# Patient Record
Sex: Female | Born: 1990 | Race: White | Hispanic: Yes | Marital: Married | Smoking: Never smoker
Health system: Southern US, Community
[De-identification: ages and names within clinical notes are randomized; demographics above are authoritative.]

## PROBLEM LIST (undated history)

## (undated) DIAGNOSIS — G43109 Migraine with aura, not intractable, without status migrainosus: Secondary | ICD-10-CM

## (undated) HISTORY — PX: NO PAST SURGERIES: SHX2092

---
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
## 2010-05-16 ENCOUNTER — Ambulatory Visit: Payer: Self-pay | Admitting: Family Medicine

## 2010-05-16 ENCOUNTER — Inpatient Hospital Stay (HOSPITAL_COMMUNITY): Admission: AD | Admit: 2010-05-16 | Discharge: 2010-05-16 | Payer: Self-pay | Admitting: Family Medicine

## 2010-05-16 ENCOUNTER — Inpatient Hospital Stay (HOSPITAL_COMMUNITY): Admission: AD | Admit: 2010-05-16 | Discharge: 2010-05-19 | Payer: Self-pay | Admitting: Obstetrics and Gynecology

## 2010-12-05 LAB — CBC
HCT: 26.4 % — ABNORMAL LOW (ref 36.0–46.0)
HCT: 33.7 % — ABNORMAL LOW (ref 36.0–46.0)
Hemoglobin: 11.5 g/dL — ABNORMAL LOW (ref 12.0–15.0)
Hemoglobin: 9 g/dL — ABNORMAL LOW (ref 12.0–15.0)
MCV: 90.2 fL (ref 78.0–100.0)
Platelets: 203 10*3/uL (ref 150–400)
RBC: 2.92 MIL/uL — ABNORMAL LOW (ref 3.87–5.11)
RBC: 3.78 MIL/uL — ABNORMAL LOW (ref 3.87–5.11)
RDW: 14.6 % (ref 11.5–15.5)
WBC: 17 10*3/uL — ABNORMAL HIGH (ref 4.0–10.5)
WBC: 18.6 10*3/uL — ABNORMAL HIGH (ref 4.0–10.5)

## 2011-09-30 ENCOUNTER — Emergency Department (HOSPITAL_COMMUNITY)
Admission: EM | Admit: 2011-09-30 | Discharge: 2011-10-01 | Disposition: A | Discharge status: Home / Self Care | Payer: 59 | Attending: Emergency Medicine | Admitting: Emergency Medicine

## 2011-09-30 ENCOUNTER — Encounter (HOSPITAL_COMMUNITY): Payer: Self-pay | Admitting: *Deleted

## 2011-09-30 ENCOUNTER — Emergency Department (HOSPITAL_COMMUNITY): Payer: 59

## 2011-09-30 DIAGNOSIS — G43109 Migraine with aura, not intractable, without status migrainosus: Secondary | ICD-10-CM

## 2011-09-30 DIAGNOSIS — G43909 Migraine, unspecified, not intractable, without status migrainosus: Secondary | ICD-10-CM | POA: Insufficient documentation

## 2011-09-30 MED ORDER — KETOROLAC TROMETHAMINE 60 MG/2ML IM SOLN
60.0000 mg | Freq: Once | INTRAMUSCULAR | Status: AC
  Administered 2011-09-30: 60 mg via INTRAMUSCULAR
  Filled 2011-09-30: qty 2

## 2011-09-30 NOTE — ED Notes (Signed)
C/o tingling and numbness in right side

## 2011-09-30 NOTE — ED Notes (Signed)
Pt reports being at work & having right side numbness & headache. Numbness & head both have decreased at this time. NAD noted.

## 2011-10-01 MED ORDER — NAPROXEN 500 MG PO TABS: 500.0000 mg | ORAL_TABLET | Freq: Two times a day (BID) | ORAL | Status: AC

## 2011-10-01 MED ORDER — ASPIRIN 81 MG PO CHEW: 81.0000 mg | CHEWABLE_TABLET | Freq: Every day | ORAL | Status: AC

## 2011-10-01 NOTE — ED Provider Notes (Signed)
History     CSN: [id number]  Arrival date & time 09/30/11  1953   First MD Initiated Contact with Patient 09/30/11 2310      Chief Complaint  Patient presents with  . Numbness    (Consider location/radiation/quality/duration/timing/severity/associated sxs/prior treatment) The history is provided by the patient.   the patient reports developing a headache earlier today and then developing numbness and tingling of her right face right arm and right lower extremity.  She denies weakness of her bilateral upper lower extremities.  She denies neck pain.  She reports her headache has a waxing and waning component to it.  She's had no changes in her vision.  She denies nausea and vomiting.  She denies recent trauma to her neck or to her head.  She reports she's never really had headaches before.  She reports her numbness and tingling are improving and began approximately 5 and a half hours ago.  There is no family history of early stroke or heart disease.  She has no other significant medical problems.  She does report she has recently been treating herself with Mucinex and Alka-Seltzer for cough and nasal congestion.  History reviewed. No pertinent past medical history.  History reviewed. No pertinent past surgical history.  No family history on file.  History  Substance Use Topics  . Smoking status: Never Smoker   . Smokeless tobacco: Not on file  . Alcohol Use: No    OB History    Grav Para Term Preterm Abortions TAB SAB Ect Mult Living                  Review of Systems  All other systems reviewed and are negative.    Allergies  Review of patient's allergies indicates no known allergies.  Home Medications   Current Outpatient Rx  Name Route Sig Dispense Refill  . GUAIFENESIN ER 600 MG PO TB12 Oral Take 600 mg by mouth 2 (two) times daily.    Marland Kitchen PHENYLEPH-CPM-DM-APAP 01-20-09-325 MG PO CAPS Oral Take 2 capsules by mouth as needed. For cold symptoms. TAKEN ONCE    . ASPIRIN  81 MG PO CHEW Oral Chew 1 tablet (81 mg total) by mouth daily. 30 tablet 0  . NAPROXEN 500 MG PO TABS Oral Take 1 tablet (500 mg total) by mouth 2 (two) times daily. 30 tablet 0    BP 120/68  Pulse 72  Temp(Src) 98.4 F (36.9 C) (Oral)  Resp 16  Ht 5' (1.524 m)  Wt 120 lb (54.432 kg)  BMI 23.44 kg/m2  SpO2 100%  LMP 09/28/2011  Physical Exam  Nursing note and vitals reviewed. Constitutional: She is oriented to person, place, and time. She appears well-developed and well-nourished.  HENT:  Head: Normocephalic and atraumatic.  Eyes: Pupils are equal, round, and reactive to light.  Neck:       No carotid bruits noted bilaterally  Cardiovascular: Normal rate and regular rhythm.   Pulmonary/Chest: Effort normal and breath sounds normal. No respiratory distress.  Abdominal: Soft.  Neurological: She is alert and oriented to person, place, and time.       5/5 strength in major muscle groups of  bilateral upper and lower extremities. Speech normal. No facial asymetry.  The patient with decreased sensation to light touch of right face right arm right lower extremities.    ED Course  Procedures (including critical care time)  Labs Reviewed - No data to display Ct Head Wo Contrast  10/01/2011  *RADIOLOGY REPORT*  Clinical Data: Altered mental status, headache  CT HEAD WITHOUT CONTRAST  Technique:  Contiguous axial images were obtained from the base of the skull through the vertex without contrast.  Comparison: None  Findings: No acute intracranial hemorrhage.  No focal mass lesion. No CT evidence of acute infarction.   No midline shift or mass effect.  No hydrocephalus.  Basilar cisterns are patent.  There cerumen within the external auditory canals.  No fluid in the mastoid air cells.  Paranasal sinuses are clear.  Orbits appear normal.  IMPRESSION: No acute intracranial findings.  Original Report Authenticated By: Genevive Bi, M.D.     1. Complicated migraine       MDM  Her  headache is improved in the emergency department with portal.  With improvement in her headache came improvement in her numbness and tingling.  I suspect this is a complicated migraine.  My suspicion for stroke is very low.  Her head CT shows no intracranial mass lesions.  She will will refer to neurology.  Until she can receive followup with neurology appears reasonable to have her on 81 mg aspirin because I think the risk of aspirin is so low.  Her cardiac rhythm is normal and there is no suggestion of A. fib.  She said no palpitations.  She has no carotid bruits on exam.  My suspicion for carotid dissection is very low.  Besides the tingling and numbness her neurologic exam is otherwise normal.  The patient understands to return to the ER for any new or worsening symptoms        Lyanne Co, MD 10/01/11 0102

## 2011-10-26 ENCOUNTER — Other Ambulatory Visit: Payer: Self-pay | Admitting: Neurology

## 2011-10-26 DIAGNOSIS — R200 Anesthesia of skin: Secondary | ICD-10-CM

## 2011-10-28 ENCOUNTER — Ambulatory Visit (HOSPITAL_COMMUNITY)
Admission: RE | Admit: 2011-10-28 | Discharge: 2011-10-28 | Disposition: A | Discharge status: Home / Self Care | Payer: 59 | Source: Ambulatory Visit | Attending: Neurology | Admitting: Neurology

## 2011-10-28 DIAGNOSIS — R200 Anesthesia of skin: Secondary | ICD-10-CM

## 2011-10-28 DIAGNOSIS — R209 Unspecified disturbances of skin sensation: Secondary | ICD-10-CM | POA: Insufficient documentation

## 2011-10-28 DIAGNOSIS — R51 Headache: Secondary | ICD-10-CM | POA: Insufficient documentation

## 2012-09-22 IMAGING — CT CT HEAD W/O CM
1 series · 16 of 30 positions shown, 20 images · non-contrast
Comparison: None

CLINICAL DATA: Altered mental status, headache

CT HEAD WITHOUT CONTRAST
TECHNIQUE: Contiguous axial images were obtained from the base of
the skull through the vertex without contrast.

[Series 2: headseq 4.8 h37s · axial · 0.43mm/px · z∈[+1156,+1286]mm · 16 of 30 slices shown, 20 images]
[im 2/30  brain]
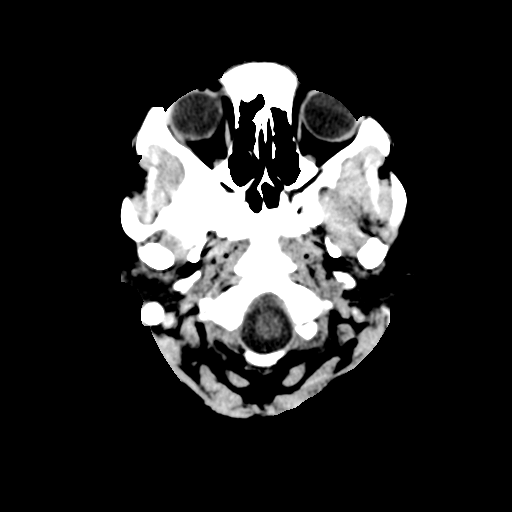
[im 2/30  bone]
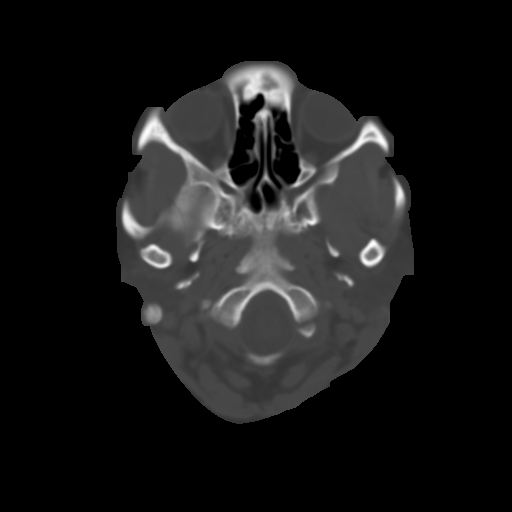
[im 4/30  brain]
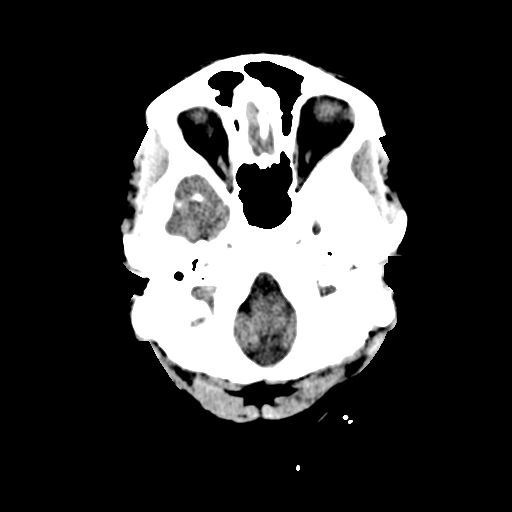
[im 6/30  brain]
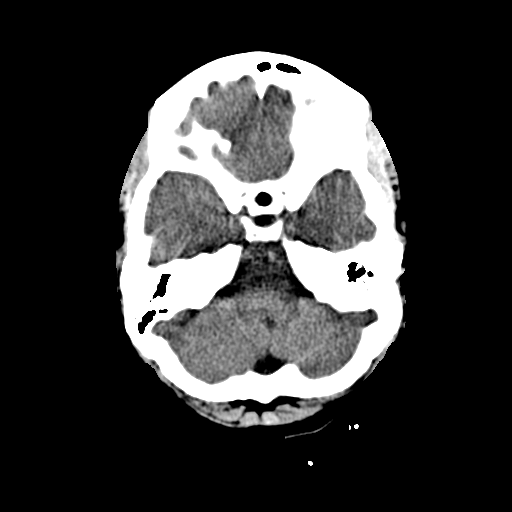
[im 8/30  brain]
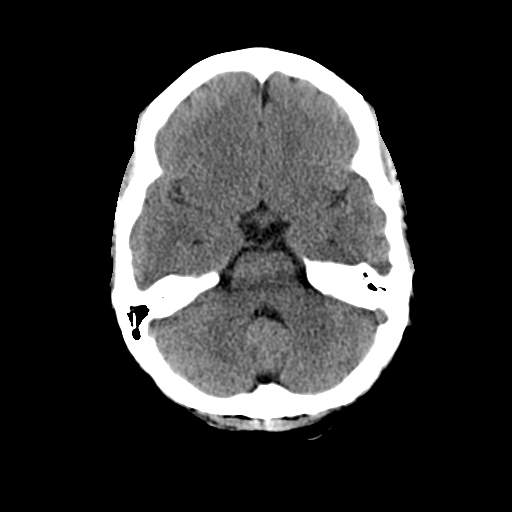
[im 9/30  brain]
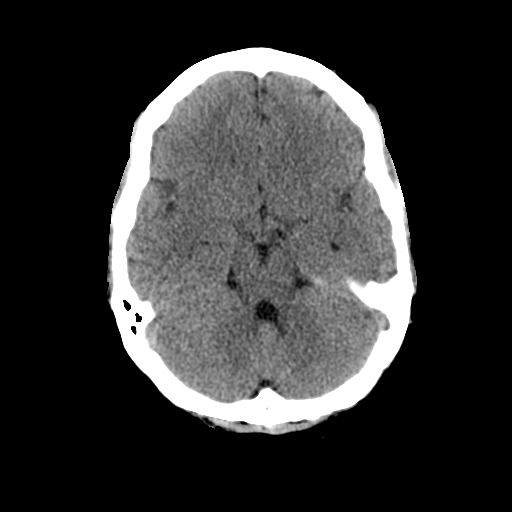
[im 9/30  bone]
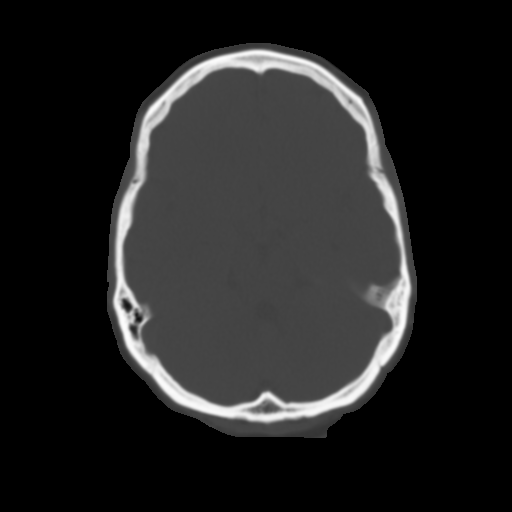
[im 11/30  brain]
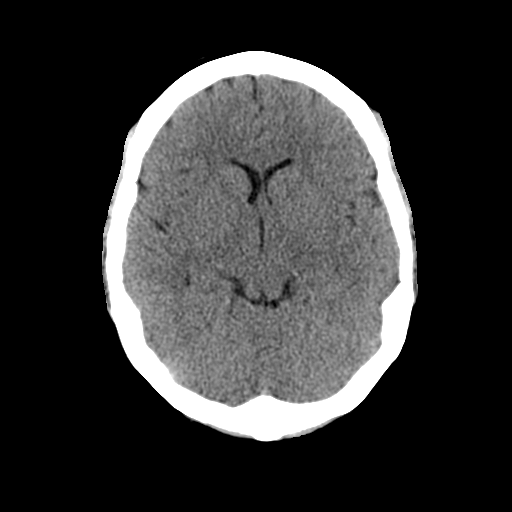
[im 13/30  brain]
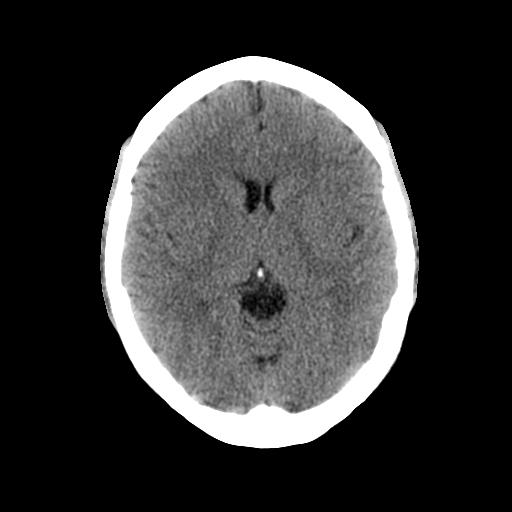
[im 15/30  brain]
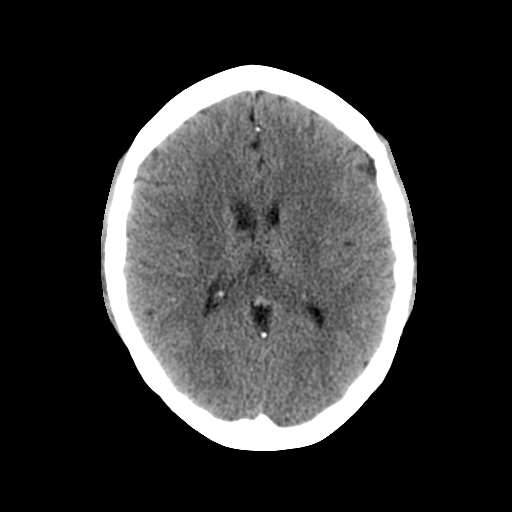
[im 16/30  brain]
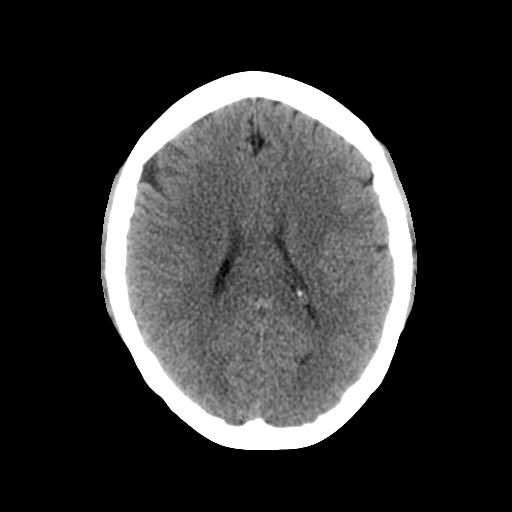
[im 16/30  bone]
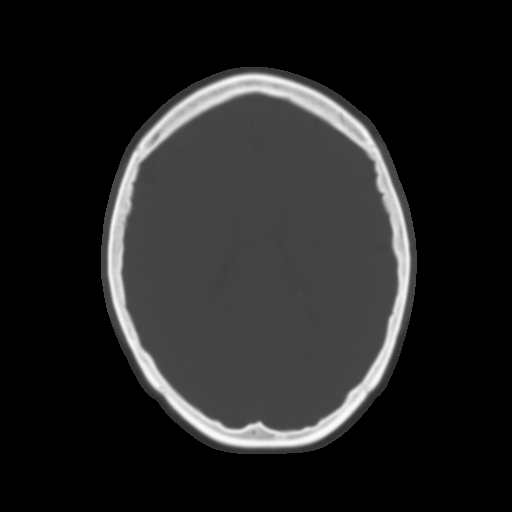
[im 18/30  brain]
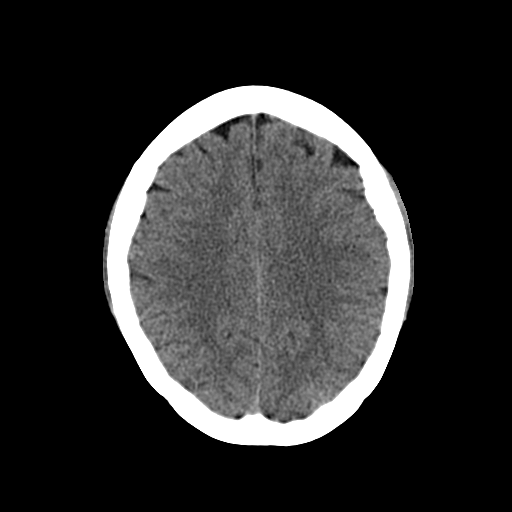
[im 20/30  brain]
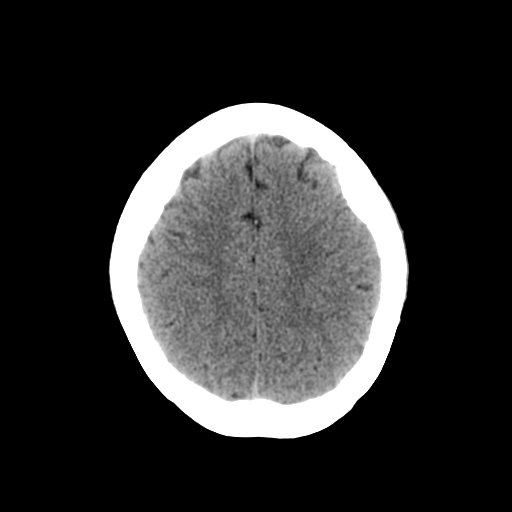
[im 22/30  brain]
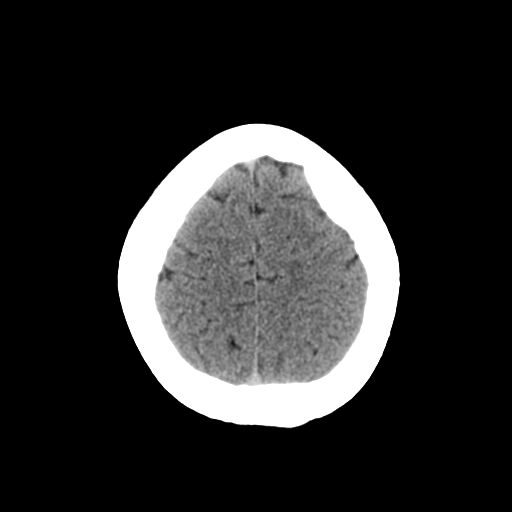
[im 23/30  brain]
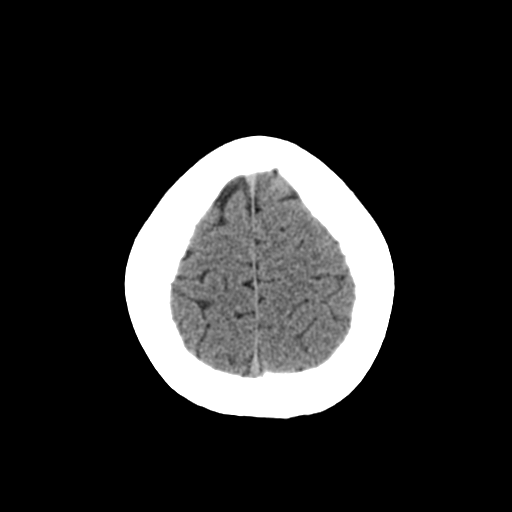
[im 23/30  bone]
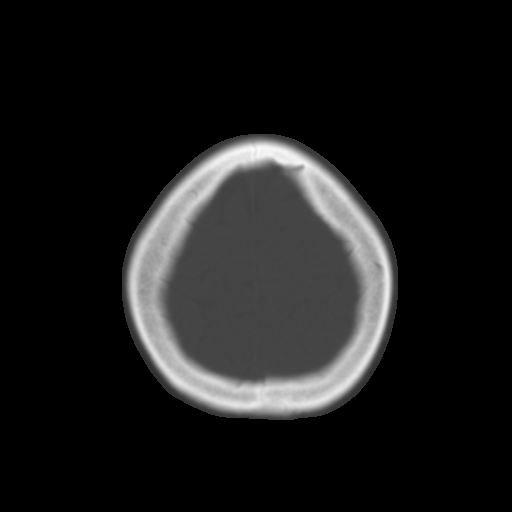
[im 25/30  brain]
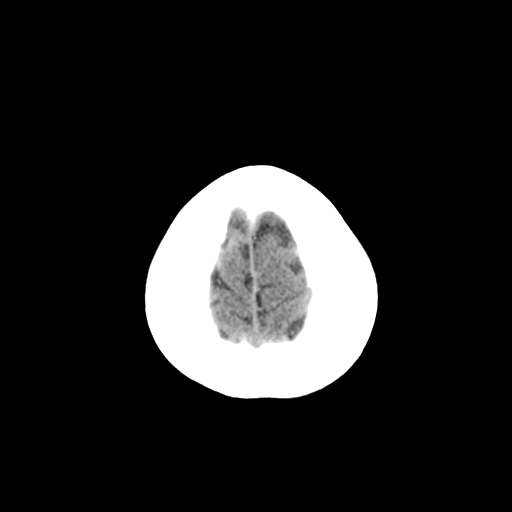
[im 27/30  brain]
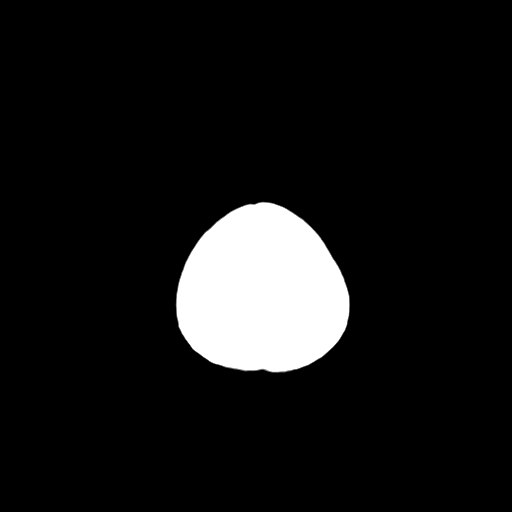
[im 29/30  brain]
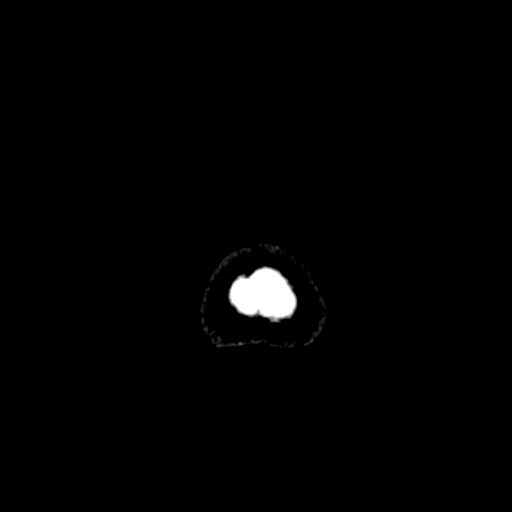

[16 of 30 positions shown; findings below may reference images not displayed]

FINDINGS: No acute intracranial hemorrhage.  No focal mass lesion.
No CT evidence of acute infarction.   No midline shift or mass
effect.  No hydrocephalus.  Basilar cisterns are patent.  There
cerumen within the external auditory canals.  No fluid in the
mastoid air cells.  Paranasal sinuses are clear.  Orbits appear
normal.
IMPRESSION: No acute intracranial findings.

## 2014-02-06 ENCOUNTER — Ambulatory Visit (INDEPENDENT_AMBULATORY_CARE_PROVIDER_SITE_OTHER): Payer: Managed Care, Other (non HMO) | Admitting: Nurse Practitioner

## 2014-02-06 ENCOUNTER — Encounter: Payer: Self-pay | Admitting: Nurse Practitioner

## 2014-02-06 ENCOUNTER — Encounter (INDEPENDENT_AMBULATORY_CARE_PROVIDER_SITE_OTHER): Payer: Self-pay

## 2014-02-06 VITALS — BP 100/66 | HR 76 | Temp 98.0°F | Ht 60.0 in | Wt 109.0 lb

## 2014-02-06 DIAGNOSIS — Z01419 Encounter for gynecological examination (general) (routine) without abnormal findings: Secondary | ICD-10-CM

## 2014-02-06 DIAGNOSIS — Z124 Encounter for screening for malignant neoplasm of cervix: Secondary | ICD-10-CM

## 2014-02-06 DIAGNOSIS — Z0000 Encounter for general adult medical examination without abnormal findings: Secondary | ICD-10-CM

## 2014-02-06 LAB — POCT URINALYSIS DIPSTICK
Bilirubin, UA: NEGATIVE
Glucose, UA: NEGATIVE
Ketones, UA: NEGATIVE
LEUKOCYTES UA: NEGATIVE
Nitrite, UA: NEGATIVE
PH UA: 5
PROTEIN UA: NEGATIVE
SPEC GRAV UA: 1.025
UROBILINOGEN UA: NEGATIVE

## 2014-02-06 LAB — POCT CBC
GRANULOCYTE PERCENT: 68.1 % (ref 37–80)
HEMATOCRIT: 40.6 % (ref 37.7–47.9)
HEMOGLOBIN: 12.8 g/dL (ref 12.2–16.2)
Lymph, poc: 2.3 (ref 0.6–3.4)
MCH, POC: 27.5 pg (ref 27–31.2)
MCHC: 31.6 g/dL — AB (ref 31.8–35.4)
MCV: 86.9 fL (ref 80–97)
MPV: 8.6 fL (ref 0–99.8)
POC GRANULOCYTE: 5.5 (ref 2–6.9)
POC LYMPH PERCENT: 28.7 %L (ref 10–50)
Platelet Count, POC: 257 10*3/uL (ref 142–424)
RBC: 4.7 M/uL (ref 4.04–5.48)
RDW, POC: 13.1 %
WBC: 8.1 10*3/uL (ref 4.6–10.2)

## 2014-02-06 NOTE — Progress Notes (Signed)
   Subjective:    Patient ID: Carly Harris, female    DOB: 03/18/1991, 23 y.o.   MRN: [id number]  HPI Patient in today for CPE and PAP- SHe currently has no medical problems and is on no meds. She has no complaints today.    Review of Systems  Constitutional: Negative.   HENT: Negative.   Respiratory: Negative.   Cardiovascular: Negative.   Gastrointestinal: Negative.   Genitourinary: Negative.   Neurological: Negative.   Hematological: Negative.   Psychiatric/Behavioral: Negative.   All other systems reviewed and are negative.      Objective:   Physical Exam  Constitutional: She is oriented to person, place, and time. She appears well-developed and well-nourished.  HENT:  Head: Normocephalic.  Right Ear: Hearing, tympanic membrane, external ear and ear canal normal.  Left Ear: Hearing, tympanic membrane, external ear and ear canal normal.  Nose: Nose normal.  Mouth/Throat: Uvula is midline and oropharynx is clear and moist.  Eyes: Conjunctivae and EOM are normal. Pupils are equal, round, and reactive to light.  Neck: Normal range of motion and full passive range of motion without pain. Neck supple. No JVD present. Carotid bruit is not present. No mass and no thyromegaly present.  Cardiovascular: Normal rate, normal heart sounds and intact distal pulses.   No murmur heard. Pulmonary/Chest: Effort normal and breath sounds normal. Right breast exhibits no inverted nipple, no mass, no nipple discharge, no skin change and no tenderness. Left breast exhibits no inverted nipple, no mass, no nipple discharge, no skin change and no tenderness.  Abdominal: Soft. Bowel sounds are normal. She exhibits no mass. There is no tenderness.  Genitourinary: Vagina normal and uterus normal. No breast swelling, tenderness, discharge or bleeding.  bimanual exam-No adnexal masses or tenderness. Cervix parous and pink - no discharge. Uterus anteverted  Musculoskeletal: Normal range of motion.    Lymphadenopathy:    She has no cervical adenopathy.  Neurological: She is alert and oriented to person, place, and time.  Skin: Skin is warm and dry.  Psychiatric: She has a normal mood and affect. Her behavior is normal. Judgment and thought content normal.   BP 100/66  Pulse 76  Temp(Src) 98 F (36.7 C) (Oral)  Ht 5' (1.524 m)  Wt 109 lb (49.442 kg)  BMI 21.29 kg/m2        Assessment & Plan:   1. Annual physical exam   2. Encounter for routine gynecological examination    Orders Placed This Encounter  Procedures  . CMP14+EGFR  . NMR, lipoprofile  . Thyroid Panel With TSH  . POCT urinalysis dipstick  . POCT CBC    Labs pending Health maintenance reviewed Diet and exercise encouraged Continue all meds Follow up  In 1 year   Mary-Margaret Martin, FNP    

## 2014-02-06 NOTE — Patient Instructions (Signed)

## 2014-02-07 LAB — NMR, LIPOPROFILE
CHOLESTEROL: 183 mg/dL (ref 100–199)
HDL Cholesterol by NMR: 52 mg/dL (ref >39)
HDL PARTICLE NUMBER: 35.6 umol/L (ref >=30.5)
LDL Particle Number: 1194 nmol/L — ABNORMAL HIGH (ref <1000)
LDL SIZE: 21.1 nm (ref >20.5)
LDLC SERPL CALC-MCNC: 111 mg/dL — ABNORMAL HIGH (ref 0–99)
LP-IR SCORE: 43 (ref <=45)
SMALL LDL PARTICLE NUMBER: 355 nmol/L (ref <=527)
TRIGLYCERIDES BY NMR: 99 mg/dL (ref 0–149)

## 2014-02-07 LAB — CMP14+EGFR
A/G RATIO: 1.9 (ref 1.1–2.5)
ALBUMIN: 4.5 g/dL (ref 3.5–5.5)
ALK PHOS: 72 IU/L (ref 39–117)
ALT: 195 IU/L — ABNORMAL HIGH (ref 0–32)
AST: 80 IU/L — ABNORMAL HIGH (ref 0–40)
BILIRUBIN TOTAL: 1 mg/dL (ref 0.0–1.2)
BUN / CREAT RATIO: 15 (ref 8–20)
BUN: 10 mg/dL (ref 6–20)
CHLORIDE: 105 mmol/L (ref 97–108)
CO2: 25 mmol/L (ref 18–29)
Calcium: 9.8 mg/dL (ref 8.7–10.2)
Creatinine, Ser: 0.66 mg/dL (ref 0.57–1.00)
GFR, EST AFRICAN AMERICAN: 142 mL/min/{1.73_m2} (ref >59)
GFR, EST NON AFRICAN AMERICAN: 123 mL/min/{1.73_m2} (ref >59)
GLOBULIN, TOTAL: 2.4 g/dL (ref 1.5–4.5)
GLUCOSE: 91 mg/dL (ref 65–99)
Potassium: 5.4 mmol/L — ABNORMAL HIGH (ref 3.5–5.2)
Sodium: 142 mmol/L (ref 134–144)
TOTAL PROTEIN: 6.9 g/dL (ref 6.0–8.5)

## 2014-02-07 LAB — THYROID PANEL WITH TSH
Free Thyroxine Index: 2 (ref 1.2–4.9)
T3 UPTAKE RATIO: 29 % (ref 24–39)
T4, Total: 6.8 ug/dL (ref 4.5–12.0)
TSH: 2.32 u[IU]/mL (ref 0.450–4.500)

## 2014-02-10 LAB — PAP IG, CT-NG, RFX HPV ASCU
Chlamydia, Nuc. Acid Amp: NEGATIVE
GONOCOCCUS BY NUCLEIC ACID AMP: NEGATIVE
PAP Smear Comment: 0

## 2014-02-13 ENCOUNTER — Telehealth: Payer: Self-pay | Admitting: Family Medicine

## 2014-02-13 NOTE — Telephone Encounter (Signed)
Message copied by Azalee Course on Tue Feb 13, 2014 12:17 PM ------      Message from: Bennie Pierini      Created: Tue Feb 13, 2014  8:02 AM       Pap negative repeat in 2 years ------

## 2014-03-29 ENCOUNTER — Encounter: Payer: Self-pay | Admitting: Adult Health

## 2014-03-29 ENCOUNTER — Ambulatory Visit (INDEPENDENT_AMBULATORY_CARE_PROVIDER_SITE_OTHER): Payer: BC Managed Care – PPO | Admitting: Adult Health

## 2014-03-29 DIAGNOSIS — Z3201 Encounter for pregnancy test, result positive: Secondary | ICD-10-CM

## 2014-03-29 LAB — POCT URINE PREGNANCY: Preg Test, Ur: POSITIVE

## 2014-04-04 ENCOUNTER — Other Ambulatory Visit: Payer: Self-pay | Admitting: Obstetrics and Gynecology

## 2014-04-04 DIAGNOSIS — O3680X0 Pregnancy with inconclusive fetal viability, not applicable or unspecified: Secondary | ICD-10-CM

## 2014-04-09 ENCOUNTER — Encounter: Payer: Self-pay | Admitting: Women's Health

## 2014-04-09 ENCOUNTER — Ambulatory Visit (INDEPENDENT_AMBULATORY_CARE_PROVIDER_SITE_OTHER): Payer: BC Managed Care – PPO | Admitting: Women's Health

## 2014-04-09 ENCOUNTER — Ambulatory Visit (INDEPENDENT_AMBULATORY_CARE_PROVIDER_SITE_OTHER): Payer: BC Managed Care – PPO

## 2014-04-09 ENCOUNTER — Other Ambulatory Visit: Payer: Self-pay | Admitting: Obstetrics and Gynecology

## 2014-04-09 VITALS — BP 88/58 | Wt 112.0 lb

## 2014-04-09 DIAGNOSIS — O26849 Uterine size-date discrepancy, unspecified trimester: Secondary | ICD-10-CM

## 2014-04-09 DIAGNOSIS — Z331 Pregnant state, incidental: Secondary | ICD-10-CM

## 2014-04-09 DIAGNOSIS — O09899 Supervision of other high risk pregnancies, unspecified trimester: Secondary | ICD-10-CM | POA: Insufficient documentation

## 2014-04-09 DIAGNOSIS — O3680X0 Pregnancy with inconclusive fetal viability, not applicable or unspecified: Secondary | ICD-10-CM

## 2014-04-09 DIAGNOSIS — Z3481 Encounter for supervision of other normal pregnancy, first trimester: Secondary | ICD-10-CM

## 2014-04-09 DIAGNOSIS — Z3480 Encounter for supervision of other normal pregnancy, unspecified trimester: Secondary | ICD-10-CM

## 2014-04-09 DIAGNOSIS — Z1389 Encounter for screening for other disorder: Secondary | ICD-10-CM

## 2014-04-09 LAB — POCT URINALYSIS DIPSTICK
Blood, UA: NEGATIVE
GLUCOSE UA: NEGATIVE
Ketones, UA: NEGATIVE
Leukocytes, UA: NEGATIVE
NITRITE UA: NEGATIVE
Protein, UA: NEGATIVE

## 2014-04-09 LAB — CBC
HEMATOCRIT: 35.3 % — AB (ref 36.0–46.0)
Hemoglobin: 11.9 g/dL — ABNORMAL LOW (ref 12.0–15.0)
MCH: 29 pg (ref 26.0–34.0)
MCHC: 33.7 g/dL (ref 30.0–36.0)
MCV: 86.1 fL (ref 78.0–100.0)
PLATELETS: 314 10*3/uL (ref 150–400)
RBC: 4.1 MIL/uL (ref 3.87–5.11)
RDW: 13.6 % (ref 11.5–15.5)
WBC: 11.1 10*3/uL — AB (ref 4.0–10.5)

## 2014-04-09 MED ORDER — DOXYLAMINE-PYRIDOXINE 10-10 MG PO TBEC: DELAYED_RELEASE_TABLET | ORAL | Status: DC

## 2014-04-09 NOTE — Patient Instructions (Signed)
Nausea & Vomiting  Have saltine crackers or pretzels by your bed and eat a few bites before you raise your head out of bed in the morning  Eat small frequent meals throughout the day instead of large meals  Drink plenty of fluids throughout the day to stay hydrated, just don't drink a lot of fluids with your meals.  This can make your stomach fill up faster making you feel sick  Do not brush your teeth right after you eat  Products with real ginger are good for nausea, like ginger ale and ginger hard candy Make sure it says made with real ginger!  Sucking on sour candy like lemon heads is also good for nausea  If your prenatal vitamins make you nauseated, take them at night so you will sleep through the nausea  If you feel like you need medicine for the nausea & vomiting please let us know  If you are unable to keep any fluids or food down please let us know    Pregnancy - First Trimester During sexual intercourse, millions of sperm go into the vagina. Only 1 sperm will penetrate and fertilize the female egg while it is in the Fallopian tube. One week later, the fertilized egg implants into the wall of the uterus. An embryo begins to develop into a baby. At 6 to 8 weeks, the eyes and face are formed and the heartbeat can be seen on ultrasound. At the end of 12 weeks (first trimester), all the baby's organs are formed. Now that you are pregnant, you will want to do everything you can to have a healthy baby. Two of the most important things are to get good prenatal care and follow your caregiver's instructions. Prenatal care is all the medical care you receive before the baby's birth. It is given to prevent, find, and treat problems during the pregnancy and childbirth. PRENATAL EXAMS  During prenatal visits, your weight, blood pressure, and urine are checked. This is done to make sure you are healthy and progressing normally during the pregnancy.  A pregnant woman should gain 25 to 35 pounds  during the pregnancy. However, if you are overweight or underweight, your caregiver will advise you regarding your weight.  Your caregiver will ask and answer questions for you.  Blood work, cervical cultures, other necessary tests, and a Pap test are done during your prenatal exams. These tests are done to check on your health and the probable health of your baby. Tests are strongly recommended and done for HIV with your permission. This is the virus that causes AIDS. These tests are done because medicines can be given to help prevent your baby from being born with this infection should you have been infected without knowing it. Blood work is also used to find out your blood type, previous infections, and follow your blood levels (hemoglobin).  Low hemoglobin (anemia) is common during pregnancy. Iron and vitamins are given to help prevent this. Later in the pregnancy, blood tests for diabetes will be done along with any other tests if any problems develop.  You may need other tests to make sure you and the baby are doing well. CHANGES DURING THE FIRST TRIMESTER  Your body goes through many changes during pregnancy. They vary from person to person. Talk to your caregiver about changes you notice and are concerned about. Changes can include:  Your menstrual period stops.  The egg and sperm carry the genes that determine what you look like. Genes from you   and your partner are forming a baby. The female genes determine whether the baby is a boy or a girl.  Your body increases in girth and you may feel bloated.  Feeling sick to your stomach (nauseous) and throwing up (vomiting). If the vomiting is uncontrollable, call your caregiver.  Your breasts will begin to enlarge and become tender.  Your nipples may stick out more and become darker.  The need to urinate more. Painful urination may mean you have a bladder infection.  Tiring easily.  Loss of appetite.  Cravings for certain kinds of  food.  At first, you may gain or lose a couple of pounds.  You may have changes in your emotions from day to day (excited to be pregnant or concerned something may go wrong with the pregnancy and baby).  You may have more vivid and strange dreams. HOME CARE INSTRUCTIONS   It is very important to avoid all smoking, alcohol and non-prescribed drugs during your pregnancy. These affect the formation and growth of the baby. Avoid chemicals while pregnant to ensure the delivery of a healthy infant.  Start your prenatal visits by the 12th week of pregnancy. They are usually scheduled monthly at first, then more often in the last 2 months before delivery. Keep your caregiver's appointments. Follow your caregiver's instructions regarding medicine use, blood and lab tests, exercise, and diet.  During pregnancy, you are providing food for you and your baby. Eat regular, well-balanced meals. Choose foods such as meat, fish, milk and other low fat dairy products, vegetables, fruits, and whole-grain breads and cereals. Your caregiver will tell you of the ideal weight gain.  You can help morning sickness by keeping soda crackers at the bedside. Eat a couple before arising in the morning. You may want to use the crackers without salt on them.  Eating 4 to 5 small meals rather than 3 large meals a day also may help the nausea and vomiting.  Drinking liquids between meals instead of during meals also seems to help nausea and vomiting.  A physical sexual relationship may be continued throughout pregnancy if there are no other problems. Problems may be early (premature) leaking of amniotic fluid from the membranes, vaginal bleeding, or belly (abdominal) pain.  Exercise regularly if there are no restrictions. Check with your caregiver or physical therapist if you are unsure of the safety of some of your exercises. Greater weight gain will occur in the last 2 trimesters of pregnancy. Exercising will  help:  Control your weight.  Keep you in shape.  Prepare you for labor and delivery.  Help you lose your pregnancy weight after you deliver your baby.  Wear a good support or jogging bra for breast tenderness during pregnancy. This may help if worn during sleep too.  Ask when prenatal classes are available. Begin classes when they are offered.  Do not use hot tubs, steam rooms, or saunas.  Wear your seat belt when driving. This protects you and your baby if you are in an accident.  Avoid raw meat, uncooked cheese, cat litter boxes, and soil used by cats throughout the pregnancy. These carry germs that can cause birth defects in the baby.  The first trimester is a good time to visit your dentist for your dental health. Getting your teeth cleaned is okay. Use a softer toothbrush and brush gently during pregnancy.  Ask for help if you have financial, counseling, or nutritional needs during pregnancy. Your caregiver will be able to offer counseling for   these needs as well as refer you for other special needs.  Do not take any medicines or herbs unless told by your caregiver.  Inform your caregiver if there is any mental or physical domestic violence.  Make a list of emergency phone numbers of family, friends, hospital, and police and fire departments.  Write down your questions. Take them to your prenatal visit.  Do not douche.  Do not cross your legs.  If you have to stand for long periods of time, rotate you feet or take small steps in a circle.  You may have more vaginal secretions that may require a sanitary pad. Do not use tampons or scented sanitary pads. MEDICINES AND DRUG USE IN PREGNANCY  Take prenatal vitamins as directed. The vitamin should contain 1 milligram of folic acid. Keep all vitamins out of reach of children. Only a couple vitamins or tablets containing iron may be fatal to a baby or young child when ingested.  Avoid use of all medicines, including herbs,  over-the-counter medicines, not prescribed or suggested by your caregiver. Only take over-the-counter or prescription medicines for pain, discomfort, or fever as directed by your caregiver. Do not use aspirin, ibuprofen, or naproxen unless directed by your caregiver.  Let your caregiver also know about herbs you may be using.  Alcohol is related to a number of birth defects. This includes fetal alcohol syndrome. All alcohol, in any form, should be avoided completely. Smoking will cause low birth rate and premature babies.  Street or illegal drugs are very harmful to the baby. They are absolutely forbidden. A baby born to an addicted mother will be addicted at birth. The baby will go through the same withdrawal an adult does.  Let your caregiver know about any medicines that you have to take and for what reason you take them. SEEK MEDICAL CARE IF:  You have any concerns or worries during your pregnancy. It is better to call with your questions if you feel they cannot wait, rather than worry about them. SEEK IMMEDIATE MEDICAL CARE IF:   An unexplained oral temperature above 102 F (38.9 C) develops, or as your caregiver suggests.  You have leaking of fluid from the vagina (birth canal). If leaking membranes are suspected, take your temperature and inform your caregiver of this when you call.  There is vaginal spotting or bleeding. Notify your caregiver of the amount and how many pads are used.  You develop a bad smelling vaginal discharge with a change in the color.  You continue to feel sick to your stomach (nauseated) and have no relief from remedies suggested. You vomit blood or coffee ground-like materials.  You lose more than 2 pounds of weight in 1 week.  You gain more than 2 pounds of weight in 1 week and you notice swelling of your face, hands, feet, or legs.  You gain 5 pounds or more in 1 week (even if you do not have swelling of your hands, face, legs, or feet).  You get  exposed to German measles and have never had them.  You are exposed to fifth disease or chickenpox.  You develop belly (abdominal) pain. Round ligament discomfort is a common non-cancerous (benign) cause of abdominal pain in pregnancy. Your caregiver still must evaluate this.  You develop headache, fever, diarrhea, pain with urination, or shortness of breath.  You fall or are in a car accident or have any kind of trauma.  There is mental or physical violence in your home. Document   Released: 09/01/2001 Document Revised: 06/01/2012 Document Reviewed: 07/18/2013 Exit[employer] Patient Information 2015 [employer]. This information is not intended to replace advice given to you by your health care provider. Make sure you discuss any questions you have with your health care provider.  

## 2014-04-09 NOTE — Progress Notes (Addendum)
  Subjective:  [name] is a 23 y.o. 772P1001 Hispanic female at 2033w6d by today's u/s, being seen today for her first obstetrical visit.  Her obstetrical history is significant for term uncomplicated SVB x 1.  Pregnancy history fully reviewed.  Patient reports n/v. Denies vb, cramping, uti s/s, abnormal/malodorous vag d/c, or vulvovaginal itching/irritation.  BP 88/58  Wt 112 lb (50.803 kg)  LMP 01/08/2014  HISTORY: OB History  Gravida Para Term Preterm AB SAB TAB Ectopic Multiple Living  2 1 1       1     # Outcome Date GA Lbr Len/2nd Weight Sex Delivery Anes PTL Lv  2 CUR           1 TRM 05/17/10 4682w0d  5 lb (2.268 kg) F SVD Other  Y     Past Medical History  Diagnosis Date  . Medical history non-contributory    Past Surgical History  Procedure Laterality Date  . No past surgeries     Family History  Problem Relation Age of Onset  . Diabetes Maternal Grandmother     Exam   System:     General: Well developed & nourished, no acute distress   Skin: Warm & dry, normal coloration and turgor, no rashes   Neurologic: Alert & oriented, normal mood   Cardiovascular: Regular rate & rhythm   Respiratory: Effort & rate normal, LCTAB, acyanotic   Abdomen: Soft, non tender   Extremities: normal strength, tone   Pelvic Exam:    Perineum: Normal perineum   Vulva: Normal, no lesions   Vagina:  Normal mucosa, normal discharge   Cervix: Normal, bulbous, appears closed   Uterus: Normal size/shape/contour for GA   Thin prep [facility] 2015[facility]  FHR: 135 via today's u/s   Assessment:   Pregnancy: G2P1001 Patient Active Problem List   Diagnosis Date Noted  . Supervision of other normal pregnancy 04/09/2014    Priority: High    4633w6d G2P1001 New OB visit N/V of pregnancy    Plan:  Initial labs drawn Continue prenatal vitamins Problem list reviewed and updated Reviewed n/v relief measures and warning s/s to report Rx diclegis, and  2 samples given.  Reviewed recommended weight gain based on pre-gravid BMI Encouraged well-balanced diet Genetic Screening discussed Integrated Screen: declined Cystic fibrosis screening discussed declined Ultrasound discussed; fetal survey: requested Follow up in 4 weeks for visit [facility] completed [facility]  Marge DuncansBooker, Frantz Quattrone Randall CNM, [facility]-BC 04/09/2014 2:54 PM

## 2014-04-09 NOTE — Addendum Note (Signed)
Addended by: Gaylyn RongEVANS, Aahana Elza A on: 04/09/2014 04:52 PM   Modules accepted: Orders

## 2014-04-09 NOTE — Progress Notes (Signed)
U/S-single IUP with +FCA noted, FHR-135 bpm, CRL c/w 6+6wks EDD 11/27/2014, cx appears closed, bilateral adnexa appears WNL

## 2014-04-10 LAB — URINE CULTURE
Colony Count: NO GROWTH
ORGANISM ID, BACTERIA: NO GROWTH

## 2014-04-10 LAB — HEPATITIS B SURFACE ANTIGEN: HEP B S AG: NEGATIVE

## 2014-04-10 LAB — URINALYSIS, ROUTINE W REFLEX MICROSCOPIC
BILIRUBIN URINE: NEGATIVE
GLUCOSE, UA: 250 mg/dL — AB
Hgb urine dipstick: NEGATIVE
KETONES UR: NEGATIVE mg/dL
Leukocytes, UA: NEGATIVE
Nitrite: NEGATIVE
PH: 8 (ref 5.0–8.0)
Protein, ur: NEGATIVE mg/dL
SPECIFIC GRAVITY, URINE: 1.02 (ref 1.005–1.030)
Urobilinogen, UA: 0.2 mg/dL (ref 0.0–1.0)

## 2014-04-10 LAB — ANTIBODY SCREEN: ANTIBODY SCREEN: NEGATIVE

## 2014-04-10 LAB — OXYCODONE SCREEN, UA, RFLX CONFIRM: Oxycodone Screen, Ur: NEGATIVE ng/mL

## 2014-04-10 LAB — DRUG SCREEN, URINE, NO CONFIRMATION
AMPHETAMINE SCRN UR: NEGATIVE
BARBITURATE QUANT UR: NEGATIVE
Benzodiazepines.: NEGATIVE
COCAINE METABOLITES: NEGATIVE
Creatinine,U: 157.1 mg/dL
Marijuana Metabolite: NEGATIVE
Methadone: NEGATIVE
OPIATE SCREEN, URINE: NEGATIVE
Phencyclidine (PCP): NEGATIVE
Propoxyphene: NEGATIVE

## 2014-04-10 LAB — RUBELLA SCREEN: RUBELLA: 6.16 {index} — AB (ref <0.90)

## 2014-04-10 LAB — HIV ANTIBODY (ROUTINE TESTING W REFLEX): HIV: NONREACTIVE

## 2014-04-10 LAB — ABO AND RH: Rh Type: POSITIVE

## 2014-04-10 LAB — RPR

## 2014-04-10 LAB — VARICELLA ZOSTER ANTIBODY, IGG: Varicella IgG: 305.8 Index — ABNORMAL HIGH (ref <135.00)

## 2014-04-17 ENCOUNTER — Telehealth: Payer: Self-pay | Admitting: Women's Health

## 2014-04-17 MED ORDER — PROMETHAZINE HCL 25 MG PO TABS: 25.0000 mg | ORAL_TABLET | Freq: Four times a day (QID) | ORAL | Status: DC | PRN

## 2014-04-23 ENCOUNTER — Telehealth: Payer: Self-pay | Admitting: *Deleted

## 2014-04-23 DIAGNOSIS — Z3481 Encounter for supervision of other normal pregnancy, first trimester: Secondary | ICD-10-CM

## 2014-04-23 DIAGNOSIS — Z029 Encounter for administrative examinations, unspecified: Secondary | ICD-10-CM

## 2014-04-23 NOTE — Telephone Encounter (Signed)
LMOM that new RX was sent to pharmacy.

## 2014-04-23 NOTE — Telephone Encounter (Signed)
Pt states she does not want to take a mediation that might make her sleepy, she states with her first pregnancy she used Zofran and it helped her, she wants to know if we could [facility] [place].

## 2014-04-24 NOTE — Telephone Encounter (Signed)
Pt states that she has been taking the Diclegis but they are not working for her. I advised the pt everything [facility] but the pt states that its just not working. Pt states that she needs something that works and doesn't make her sleepy. I advised the pt [facility]. Pt verbalized understanding.

## 2014-04-25 NOTE — Telephone Encounter (Signed)
Taking diclegis, not working well, doesn't want phenergan or anything that will make her sleepy. Took zofran last pregnancy- discussed potential risks for heart defects & cleft lip/palate w/ zofran. Pt wants to try other natural methods, 'sea bands', etc. If not helping, to let us know. If unable to keep any food/fluids down/feels dehydrated, to call us back/go to [facility] for fluids.  Cheral MarkerKimberly R. Keymani Harris, CNM, [facility]NP-BC 04/25/2014 1:57 PM

## 2014-05-07 ENCOUNTER — Ambulatory Visit (INDEPENDENT_AMBULATORY_CARE_PROVIDER_SITE_OTHER): Payer: Self-pay | Admitting: Women's Health

## 2014-05-07 VITALS — BP 84/58 | Wt 109.0 lb

## 2014-05-07 DIAGNOSIS — Z3480 Encounter for supervision of other normal pregnancy, unspecified trimester: Secondary | ICD-10-CM

## 2014-05-07 DIAGNOSIS — Z1389 Encounter for screening for other disorder: Secondary | ICD-10-CM

## 2014-05-07 DIAGNOSIS — Z331 Pregnant state, incidental: Secondary | ICD-10-CM

## 2014-05-07 DIAGNOSIS — Z3481 Encounter for supervision of other normal pregnancy, first trimester: Secondary | ICD-10-CM

## 2014-05-07 LAB — POCT URINALYSIS DIPSTICK
Blood, UA: NEGATIVE
GLUCOSE UA: NEGATIVE
Ketones, UA: NEGATIVE
LEUKOCYTES UA: NEGATIVE
NITRITE UA: NEGATIVE
Protein, UA: NEGATIVE

## 2014-05-07 NOTE — Progress Notes (Addendum)
Low-risk OB appointment G2P1001 6141w6d Estimated Date of Delivery: 11/27/14 BP 84/58  Wt 109 lb (49.442 kg)  LMP 01/08/2014  BP, weight, and urine reviewed.  Refer to obstetrical flow sheet for FH & FHR.  Denies cramping, lof, vb, or uti s/s. No complaints. Nausea much better! Reviewed warning s/s to report. Plan:  Continue routine obstetrical care  F/U in 4wks for OB appointment  Declines NT/IT

## 2014-05-07 NOTE — Patient Instructions (Signed)
First Trimester of Pregnancy The first trimester of pregnancy is from week 1 until the end of week 12 (months 1 through 3). A week after a sperm fertilizes an egg, the egg will implant on the wall of the uterus. This embryo will begin to develop into a baby. Genes from you and your partner are forming the baby. The female genes determine whether the baby is a boy or a girl. At 6-8 weeks, the eyes and face are formed, and the heartbeat can be seen on ultrasound. At the end of 12 weeks, all the baby's organs are formed.  Now that you are pregnant, you will want to do everything you can to have a healthy baby. Two of the most important things are to get good prenatal care and to follow your health care provider's instructions. Prenatal care is all the medical care you receive before the baby's birth. This care will help prevent, find, and treat any problems during the pregnancy and childbirth. BODY CHANGES Your body goes through many changes during pregnancy. The changes vary from woman to woman.   You may gain or lose a couple of pounds at first.  You may feel sick to your stomach (nauseous) and throw up (vomit). If the vomiting is uncontrollable, call your health care provider.  You may tire easily.  You may develop headaches that can be relieved by medicines approved by your health care provider.  You may urinate more often. Painful urination may mean you have a bladder infection.  You may develop heartburn as a result of your pregnancy.  You may develop constipation because certain hormones are causing the muscles that push waste through your intestines to slow down.  You may develop hemorrhoids or swollen, bulging veins (varicose veins).  Your breasts may begin to grow larger and become tender. Your nipples may stick out more, and the tissue that surrounds them (areola) may become darker.  Your gums may bleed and may be sensitive to brushing and flossing.  Dark spots or blotches (chloasma,  mask of pregnancy) may develop on your face. This will likely fade after the baby is born.  Your menstrual periods will stop.  You may have a loss of appetite.  You may develop cravings for certain kinds of food.  You may have changes in your emotions from day to day, such as being excited to be pregnant or being concerned that something may go wrong with the pregnancy and baby.  You may have more vivid and strange dreams.  You may have changes in your hair. These can include thickening of your hair, rapid growth, and changes in texture. Some women also have hair loss during or after pregnancy, or hair that feels dry or thin. Your hair will most likely return to normal after your baby is born. WHAT TO EXPECT AT YOUR PRENATAL VISITS During a routine prenatal visit:  You will be weighed to make sure you and the baby are growing normally.  Your blood pressure will be taken.  Your abdomen will be measured to track your baby's growth.  The fetal heartbeat will be listened to starting around week 10 or 12 of your pregnancy.  Test results from any previous visits will be discussed. Your health care provider may ask you:  How you are feeling.  If you are feeling the baby move.  If you have had any abnormal symptoms, such as leaking fluid, bleeding, severe headaches, or abdominal cramping.  If you have any questions. Other tests   that may be performed during your first trimester include:  Blood tests to find your blood type and to check for the presence of any previous infections. They will also be used to check for low iron levels (anemia) and Rh antibodies. Later in the pregnancy, blood tests for diabetes will be done along with other tests if problems develop.  Urine tests to check for infections, diabetes, or protein in the urine.  An ultrasound to confirm the proper growth and development of the baby.  An amniocentesis to check for possible genetic problems.  Fetal screens for  spina bifida and Down syndrome.  You may need other tests to make sure you and the baby are doing well. HOME CARE INSTRUCTIONS  Medicines  Follow your health care provider's instructions regarding medicine use. Specific medicines may be either safe or unsafe to take during pregnancy.  Take your prenatal vitamins as directed.  If you develop constipation, try taking a stool softener if your health care provider approves. Diet  Eat regular, well-balanced meals. Choose a variety of foods, such as meat or vegetable-based protein, fish, milk and low-fat dairy products, vegetables, fruits, and whole grain breads and cereals. Your health care provider will help you determine the amount of weight gain that is right for you.  Avoid raw meat and uncooked cheese. These carry germs that can cause birth defects in the baby.  Eating four or five small meals rather than three large meals a day may help relieve nausea and vomiting. If you start to feel nauseous, eating a few soda crackers can be helpful. Drinking liquids between meals instead of during meals also seems to help nausea and vomiting.  If you develop constipation, eat more high-fiber foods, such as fresh vegetables or fruit and whole grains. Drink enough fluids to keep your urine clear or pale yellow. Activity and Exercise  Exercise only as directed by your health care provider. Exercising will help you:  Control your weight.  Stay in shape.  Be prepared for labor and delivery.  Experiencing pain or cramping in the lower abdomen or low back is a good sign that you should stop exercising. Check with your health care provider before continuing normal exercises.  Try to avoid standing for long periods of time. Move your legs often if you must stand in one place for a long time.  Avoid heavy lifting.  Wear low-heeled shoes, and practice good posture.  You may continue to have sex unless your health care provider directs you  otherwise. Relief of Pain or Discomfort  Wear a good support bra for breast tenderness.   Take warm sitz baths to soothe any pain or discomfort caused by hemorrhoids. Use hemorrhoid cream if your health care provider approves.   Rest with your legs elevated if you have leg cramps or low back pain.  If you develop varicose veins in your legs, wear support hose. Elevate your feet for 15 minutes, 3-4 times a day. Limit salt in your diet. Prenatal Care  Schedule your prenatal visits by the twelfth week of pregnancy. They are usually scheduled monthly at first, then more often in the last 2 months before delivery.  Write down your questions. Take them to your prenatal visits.  Keep all your prenatal visits as directed by your health care provider. Safety  Wear your seat belt at all times when driving.  Make a list of emergency phone numbers, including numbers for family, friends, the hospital, and police and fire departments. General Tips    Ask your health care provider for a referral to a local prenatal education class. Begin classes no later than at the beginning of month 6 of your pregnancy.  Ask for help if you have counseling or nutritional needs during pregnancy. Your health care provider can offer advice or refer you to specialists for help with various needs.  Do not use hot tubs, steam rooms, or saunas.  Do not douche or use tampons or scented sanitary pads.  Do not cross your legs for long periods of time.  Avoid cat litter boxes and soil used by cats. These carry germs that can cause birth defects in the baby and possibly loss of the fetus by miscarriage or stillbirth.  Avoid all smoking, herbs, alcohol, and medicines not prescribed by your health care provider. Chemicals in these affect the formation and growth of the baby.  Schedule a dentist appointment. At home, brush your teeth with a soft toothbrush and be gentle when you floss. SEEK MEDICAL CARE IF:   You have  dizziness.  You have mild pelvic cramps, pelvic pressure, or nagging pain in the abdominal area.  You have persistent nausea, vomiting, or diarrhea.  You have a bad smelling vaginal discharge.  You have pain with urination.  You notice increased swelling in your face, hands, legs, or ankles. SEEK IMMEDIATE MEDICAL CARE IF:   You have a fever.  You are leaking fluid from your vagina.  You have spotting or bleeding from your vagina.  You have severe abdominal cramping or pain.  You have rapid weight gain or loss.  You vomit blood or material that looks like coffee grounds.  You are exposed to German measles and have never had them.  You are exposed to fifth disease or chickenpox.  You develop a severe headache.  You have shortness of breath.  You have any kind of trauma, such as from a fall or a car accident. Document Released: 09/01/2001 Document Revised: 01/22/2014 Document Reviewed: 07/18/2013 Exit[employer] Patient Information 2015 [employer]. This information is not intended to replace advice given to you by your health care provider. Make sure you discuss any questions you have with your health care provider.  

## 2014-05-08 ENCOUNTER — Encounter: Payer: Self-pay | Admitting: Women's Health

## 2014-05-08 LAB — GC/CHLAMYDIA PROBE AMP
CT Probe RNA: NEGATIVE
GC Probe RNA: NEGATIVE

## 2014-06-04 ENCOUNTER — Ambulatory Visit (INDEPENDENT_AMBULATORY_CARE_PROVIDER_SITE_OTHER): Payer: BC Managed Care – PPO | Admitting: Women's Health

## 2014-06-04 ENCOUNTER — Encounter: Payer: Self-pay | Admitting: Women's Health

## 2014-06-04 VITALS — BP 90/48 | Wt 108.0 lb

## 2014-06-04 DIAGNOSIS — Z1389 Encounter for screening for other disorder: Secondary | ICD-10-CM

## 2014-06-04 DIAGNOSIS — Z331 Pregnant state, incidental: Secondary | ICD-10-CM

## 2014-06-04 DIAGNOSIS — Z3482 Encounter for supervision of other normal pregnancy, second trimester: Secondary | ICD-10-CM

## 2014-06-04 DIAGNOSIS — Z3480 Encounter for supervision of other normal pregnancy, unspecified trimester: Secondary | ICD-10-CM

## 2014-06-04 LAB — POCT URINALYSIS DIPSTICK
Blood, UA: NEGATIVE
Glucose, UA: NEGATIVE
KETONES UA: NEGATIVE
LEUKOCYTES UA: NEGATIVE
Nitrite, UA: NEGATIVE
PROTEIN UA: NEGATIVE

## 2014-06-04 NOTE — Patient Instructions (Signed)
Second Trimester of Pregnancy The second trimester is from week 13 through week 28, months 4 through 6. The second trimester is often a time when you feel your best. Your body has also adjusted to being pregnant, and you begin to feel better physically. Usually, morning sickness has lessened or quit completely, you may have more energy, and you may have an increase in appetite. The second trimester is also a time when the fetus is growing rapidly. At the end of the sixth month, the fetus is about 9 inches long and weighs about 1 pounds. You will likely begin to feel the baby move (quickening) between 18 and 20 weeks of the pregnancy. BODY CHANGES Your body goes through many changes during pregnancy. The changes vary from woman to woman.   Your weight will continue to increase. You will notice your lower abdomen bulging out.  You may begin to get stretch marks on your hips, abdomen, and breasts.  You may develop headaches that can be relieved by medicines approved by your health care provider.  You may urinate more often because the fetus is pressing on your bladder.  You may develop or continue to have heartburn as a result of your pregnancy.  You may develop constipation because certain hormones are causing the muscles that push waste through your intestines to slow down.  You may develop hemorrhoids or swollen, bulging veins (varicose veins).  You may have back pain because of the weight gain and pregnancy hormones relaxing your joints between the bones in your pelvis and as a result of a shift in weight and the muscles that support your balance.  Your breasts will continue to grow and be tender.  Your gums may bleed and may be sensitive to brushing and flossing.  Dark spots or blotches (chloasma, mask of pregnancy) may develop on your face. This will likely fade after the baby is born.  A dark line from your belly button to the pubic area (linea nigra) may appear. This will likely fade  after the baby is born.  You may have changes in your hair. These can include thickening of your hair, rapid growth, and changes in texture. Some women also have hair loss during or after pregnancy, or hair that feels dry or thin. Your hair will most likely return to normal after your baby is born. WHAT TO EXPECT AT YOUR PRENATAL VISITS During a routine prenatal visit:  You will be weighed to make sure you and the fetus are growing normally.  Your blood pressure will be taken.  Your abdomen will be measured to track your baby's growth.  The fetal heartbeat will be listened to.  Any test results from the previous visit will be discussed. Your health care provider may ask you:  How you are feeling.  If you are feeling the baby move.  If you have had any abnormal symptoms, such as leaking fluid, bleeding, severe headaches, or abdominal cramping.  If you have any questions. Other tests that may be performed during your second trimester include:  Blood tests that check for:  Low iron levels (anemia).  Gestational diabetes (between 24 and 28 weeks).  Rh antibodies.  Urine tests to check for infections, diabetes, or protein in the urine.  An ultrasound to confirm the proper growth and development of the baby.  An amniocentesis to check for possible genetic problems.  Fetal screens for spina bifida and Down syndrome. HOME CARE INSTRUCTIONS   Avoid all smoking, herbs, alcohol, and unprescribed   drugs. These chemicals affect the formation and growth of the baby.  Follow your health care provider's instructions regarding medicine use. There are medicines that are either safe or unsafe to take during pregnancy.  Exercise only as directed by your health care provider. Experiencing uterine cramps is a good sign to stop exercising.  Continue to eat regular, healthy meals.  Wear a good support bra for breast tenderness.  Do not use hot tubs, steam rooms, or saunas.  Wear your  seat belt at all times when driving.  Avoid raw meat, uncooked cheese, cat litter boxes, and soil used by cats. These carry germs that can cause birth defects in the baby.  Take your prenatal vitamins.  Try taking a stool softener (if your health care provider approves) if you develop constipation. Eat more high-fiber foods, such as fresh vegetables or fruit and whole grains. Drink plenty of fluids to keep your urine clear or pale yellow.  Take warm sitz baths to soothe any pain or discomfort caused by hemorrhoids. Use hemorrhoid cream if your health care provider approves.  If you develop varicose veins, wear support hose. Elevate your feet for 15 minutes, 3-4 times a day. Limit salt in your diet.  Avoid heavy lifting, wear low heel shoes, and practice good posture.  Rest with your legs elevated if you have leg cramps or low back pain.  Visit your dentist if you have not gone yet during your pregnancy. Use a soft toothbrush to brush your teeth and be gentle when you floss.  A sexual relationship may be continued unless your health care provider directs you otherwise.  Continue to go to all your prenatal visits as directed by your health care provider. SEEK MEDICAL CARE IF:   You have dizziness.  You have mild pelvic cramps, pelvic pressure, or nagging pain in the abdominal area.  You have persistent nausea, vomiting, or diarrhea.  You have a bad smelling vaginal discharge.  You have pain with urination. SEEK IMMEDIATE MEDICAL CARE IF:   You have a fever.  You are leaking fluid from your vagina.  You have spotting or bleeding from your vagina.  You have severe abdominal cramping or pain.  You have rapid weight gain or loss.  You have shortness of breath with chest pain.  You notice sudden or extreme swelling of your face, hands, ankles, feet, or legs.  You have not felt your baby move in over an hour.  You have severe headaches that do not go away with  medicine.  You have vision changes. Document Released: 09/01/2001 Document Revised: 09/12/2013 Document Reviewed: 11/08/2012 Exit[employer] Patient Information 2015 [employer]. This information is not intended to replace advice given to you by your health care provider. Make sure you discuss any questions you have with your health care provider.  

## 2014-06-04 NOTE — Progress Notes (Signed)
Low-risk OB appointment G2P1001 [redacted]w[redacted]d Estimated Date of Delivery: 11/27/14 BP 90/48  Wt 108 lb (48.988 kg)  LMP 01/08/2014  BP, weight, and urine reviewed.  Refer to obstetrical flow sheet for FH & FHR.  No fm yet. Denies cramping, lof, vb, or uti s/s. Some ha's, to increase fluids, apap prn Has lost 4lbs since 6wks, states she's eating well, no n/v/d. 24hr diet recall sounds adequate and nutritious. Will monitor weight.  Reviewed warning s/s to report. Plan:  Continue routine obstetrical care  F/U in 3wks for OB appointment Declines NT/IT

## 2014-06-25 ENCOUNTER — Encounter: Payer: BC Managed Care – PPO | Admitting: Women's Health

## 2014-06-27 ENCOUNTER — Ambulatory Visit (INDEPENDENT_AMBULATORY_CARE_PROVIDER_SITE_OTHER): Payer: BC Managed Care – PPO | Admitting: Women's Health

## 2014-06-27 ENCOUNTER — Encounter: Payer: Self-pay | Admitting: Women's Health

## 2014-06-27 VITALS — BP 94/50 | Wt 110.0 lb

## 2014-06-27 DIAGNOSIS — Z1389 Encounter for screening for other disorder: Secondary | ICD-10-CM

## 2014-06-27 DIAGNOSIS — Z3482 Encounter for supervision of other normal pregnancy, second trimester: Secondary | ICD-10-CM

## 2014-06-27 DIAGNOSIS — Z331 Pregnant state, incidental: Secondary | ICD-10-CM

## 2014-06-27 DIAGNOSIS — Z363 Encounter for antenatal screening for malformations: Secondary | ICD-10-CM

## 2014-06-27 DIAGNOSIS — Z3492 Encounter for supervision of normal pregnancy, unspecified, second trimester: Secondary | ICD-10-CM

## 2014-06-27 LAB — POCT URINALYSIS DIPSTICK
KETONES UA: NEGATIVE
Leukocytes, UA: NEGATIVE
NITRITE UA: NEGATIVE
Protein, UA: NEGATIVE
RBC UA: NEGATIVE

## 2014-06-27 NOTE — Progress Notes (Signed)
Low-risk OB appointment G2P1001 6159w1d Estimated Date of Delivery: 11/27/14 BP 94/50  Wt 110 lb (49.896 kg)  LMP 01/08/2014  BP, weight, and urine reviewed.  Refer to obstetrical flow sheet for FH & FHR.  Reports good fm.  Denies regular uc's, lof, vb, or uti s/s. No complaints. Reviewed warning s/s to report, fm. Plan:  Continue routine obstetrical care  F/U in 2wks for OB appointment and anatomy u/s Declined flu shot, wants to think about it some more

## 2014-06-27 NOTE — Patient Instructions (Signed)
Second Trimester of Pregnancy The second trimester is from week 13 through week 28, months 4 through 6. The second trimester is often a time when you feel your best. Your body has also adjusted to being pregnant, and you begin to feel better physically. Usually, morning sickness has lessened or quit completely, you may have more energy, and you may have an increase in appetite. The second trimester is also a time when the fetus is growing rapidly. At the end of the sixth month, the fetus is about 9 inches long and weighs about 1 pounds. You will likely begin to feel the baby move (quickening) between 18 and 20 weeks of the pregnancy. BODY CHANGES Your body goes through many changes during pregnancy. The changes vary from woman to woman.   Your weight will continue to increase. You will notice your lower abdomen bulging out.  You may begin to get stretch marks on your hips, abdomen, and breasts.  You may develop headaches that can be relieved by medicines approved by your health care provider.  You may urinate more often because the fetus is pressing on your bladder.  You may develop or continue to have heartburn as a result of your pregnancy.  You may develop constipation because certain hormones are causing the muscles that push waste through your intestines to slow down.  You may develop hemorrhoids or swollen, bulging veins (varicose veins).  You may have back pain because of the weight gain and pregnancy hormones relaxing your joints between the bones in your pelvis and as a result of a shift in weight and the muscles that support your balance.  Your breasts will continue to grow and be tender.  Your gums may bleed and may be sensitive to brushing and flossing.  Dark spots or blotches (chloasma, mask of pregnancy) may develop on your face. This will likely fade after the baby is born.  A dark line from your belly button to the pubic area (linea nigra) may appear. This will likely fade  after the baby is born.  You may have changes in your hair. These can include thickening of your hair, rapid growth, and changes in texture. Some women also have hair loss during or after pregnancy, or hair that feels dry or thin. Your hair will most likely return to normal after your baby is born. WHAT TO EXPECT AT YOUR PRENATAL VISITS During a routine prenatal visit:  You will be weighed to make sure you and the fetus are growing normally.  Your blood pressure will be taken.  Your abdomen will be measured to track your baby's growth.  The fetal heartbeat will be listened to.  Any test results from the previous visit will be discussed. Your health care provider may ask you:  How you are feeling.  If you are feeling the baby move.  If you have had any abnormal symptoms, such as leaking fluid, bleeding, severe headaches, or abdominal cramping.  If you have any questions. Other tests that may be performed during your second trimester include:  Blood tests that check for:  Low iron levels (anemia).  Gestational diabetes (between 24 and 28 weeks).  Rh antibodies.  Urine tests to check for infections, diabetes, or protein in the urine.  An ultrasound to confirm the proper growth and development of the baby.  An amniocentesis to check for possible genetic problems.  Fetal screens for spina bifida and Down syndrome. HOME CARE INSTRUCTIONS   Avoid all smoking, herbs, alcohol, and unprescribed   drugs. These chemicals affect the formation and growth of the baby.  Follow your health care provider's instructions regarding medicine use. There are medicines that are either safe or unsafe to take during pregnancy.  Exercise only as directed by your health care provider. Experiencing uterine cramps is a good sign to stop exercising.  Continue to eat regular, healthy meals.  Wear a good support bra for breast tenderness.  Do not use hot tubs, steam rooms, or saunas.  Wear your  seat belt at all times when driving.  Avoid raw meat, uncooked cheese, cat litter boxes, and soil used by cats. These carry germs that can cause birth defects in the baby.  Take your prenatal vitamins.  Try taking a stool softener (if your health care provider approves) if you develop constipation. Eat more high-fiber foods, such as fresh vegetables or fruit and whole grains. Drink plenty of fluids to keep your urine clear or pale yellow.  Take warm sitz baths to soothe any pain or discomfort caused by hemorrhoids. Use hemorrhoid cream if your health care provider approves.  If you develop varicose veins, wear support hose. Elevate your feet for 15 minutes, 3-4 times a day. Limit salt in your diet.  Avoid heavy lifting, wear low heel shoes, and practice good posture.  Rest with your legs elevated if you have leg cramps or low back pain.  Visit your dentist if you have not gone yet during your pregnancy. Use a soft toothbrush to brush your teeth and be gentle when you floss.  A sexual relationship may be continued unless your health care provider directs you otherwise.  Continue to go to all your prenatal visits as directed by your health care provider. SEEK MEDICAL CARE IF:   You have dizziness.  You have mild pelvic cramps, pelvic pressure, or nagging pain in the abdominal area.  You have persistent nausea, vomiting, or diarrhea.  You have a bad smelling vaginal discharge.  You have pain with urination. SEEK IMMEDIATE MEDICAL CARE IF:   You have a fever.  You are leaking fluid from your vagina.  You have spotting or bleeding from your vagina.  You have severe abdominal cramping or pain.  You have rapid weight gain or loss.  You have shortness of breath with chest pain.  You notice sudden or extreme swelling of your face, hands, ankles, feet, or legs.  You have not felt your baby move in over an hour.  You have severe headaches that do not go away with  medicine.  You have vision changes. Document Released: 09/01/2001 Document Revised: 09/12/2013 Document Reviewed: 11/08/2012 Exit[employer] Patient Information 2015 [employer]. This information is not intended to replace advice given to you by your health care provider. Make sure you discuss any questions you have with your health care provider.  

## 2014-07-10 ENCOUNTER — Telehealth: Payer: Self-pay | Admitting: Nurse Practitioner

## 2014-07-11 ENCOUNTER — Other Ambulatory Visit: Payer: BC Managed Care – PPO

## 2014-07-11 ENCOUNTER — Encounter: Payer: BC Managed Care – PPO | Admitting: Advanced Practice Midwife

## 2014-07-12 ENCOUNTER — Ambulatory Visit (INDEPENDENT_AMBULATORY_CARE_PROVIDER_SITE_OTHER): Payer: BC Managed Care – PPO

## 2014-07-12 ENCOUNTER — Ambulatory Visit (INDEPENDENT_AMBULATORY_CARE_PROVIDER_SITE_OTHER): Payer: BC Managed Care – PPO | Admitting: Advanced Practice Midwife

## 2014-07-12 ENCOUNTER — Encounter: Payer: Self-pay | Admitting: Advanced Practice Midwife

## 2014-07-12 VITALS — BP 110/70 | Wt 110.0 lb

## 2014-07-12 DIAGNOSIS — Z3482 Encounter for supervision of other normal pregnancy, second trimester: Secondary | ICD-10-CM

## 2014-07-12 DIAGNOSIS — Z1389 Encounter for screening for other disorder: Secondary | ICD-10-CM

## 2014-07-12 DIAGNOSIS — Z363 Encounter for antenatal screening for malformations: Secondary | ICD-10-CM

## 2014-07-12 DIAGNOSIS — Z36 Encounter for antenatal screening of mother: Secondary | ICD-10-CM

## 2014-07-12 DIAGNOSIS — Z331 Pregnant state, incidental: Secondary | ICD-10-CM

## 2014-07-12 LAB — POCT URINALYSIS DIPSTICK
Glucose, UA: NEGATIVE
KETONES UA: NEGATIVE
LEUKOCYTES UA: NEGATIVE
NITRITE UA: NEGATIVE
Protein, UA: NEGATIVE
RBC UA: NEGATIVE

## 2014-07-12 NOTE — Progress Notes (Signed)
U/S(20+2wks) active fetus, meas c/w dates, fluid wnl, posterior Gr 0 placenta, cx appears closed, bilateral adnexa appears WNL, female fetus, no major abnl noted , FHR-147 bpm

## 2014-07-12 NOTE — Progress Notes (Signed)
G2P1001 6057w2d Estimated Date of Delivery: 11/27/14  Blood pressure 110/70, weight 110 lb (49.896 kg), last menstrual period 01/08/2014.   BP weight and urine results all reviewed and noted.  Please refer to the obstetrical flow sheet for the fundal height and fetal heart rate documentation: Had anatomy scan:  Normal female  Patient reports good fetal movement, denies any bleeding and no rupture of membranes symptoms or regular contractions. Patient is without complaints. All questions were answered.  Plan:  Continued routine obstetrical care,   Follow up in 4 weeks for OB appointment,

## 2014-07-12 NOTE — Progress Notes (Signed)
Pt denies any problems or concerns at this time.  

## 2014-07-17 ENCOUNTER — Ambulatory Visit (INDEPENDENT_AMBULATORY_CARE_PROVIDER_SITE_OTHER): Payer: BC Managed Care – PPO | Admitting: Family Medicine

## 2014-07-17 VITALS — BP 91/56 | HR 83 | Temp 97.5°F | Ht 60.0 in | Wt 110.8 lb

## 2014-07-17 DIAGNOSIS — J069 Acute upper respiratory infection, unspecified: Secondary | ICD-10-CM

## 2014-07-17 MED ORDER — AZITHROMYCIN 250 MG PO TABS: ORAL_TABLET | ORAL | Status: DC

## 2014-07-18 NOTE — Progress Notes (Signed)
   Subjective:    Patient ID: Carly Harris, female    DOB: Feb 22, 1991, 23 y.o.   MRN: [id number]  HPI This 23 y.o. female presents for evaluation of URI sx's.   Review of Systems No chest pain, SOB, HA, dizziness, vision change, N/V, diarrhea, constipation, dysuria, urinary urgency or frequency, myalgias, arthralgias or rash.     Objective:   Physical Exam  Vital signs noted  Well developed well nourished female.  HEENT - Head atraumatic Normocephalic                Eyes - PERRLA, Conjuctiva - clear Sclera- Clear EOMI                Ears - EAC's Wnl TM's Wnl Gross Hearing WNL                Nose - Nares patent                 Throat - oropharanx wnl Respiratory - Lungs CTA bilateral Cardiac - RRR S1 and S2 without murmur GI - Abdomen soft Nontender and bowel sounds active x 4 Extremities - No edema. Neuro - Grossly intact.      Assessment & Plan:  URI (upper respiratory infection) - Plan: azithromycin (ZITHROMAX) 250 MG tablet Push po fluids, rest, tylenol and motrin otc prn as directed for fever, arthralgias, and myalgias.  Follow up prn if sx's continue or persist.  Carly CanterWilliam J Abigail Marsiglia FNP

## 2014-07-19 NOTE — Telephone Encounter (Signed)
Pt was seen on Wednesday so will close this encounter.

## 2014-07-23 ENCOUNTER — Encounter: Payer: Self-pay | Admitting: Advanced Practice Midwife

## 2014-08-09 ENCOUNTER — Encounter: Payer: Self-pay | Admitting: Advanced Practice Midwife

## 2014-08-09 ENCOUNTER — Ambulatory Visit (INDEPENDENT_AMBULATORY_CARE_PROVIDER_SITE_OTHER): Payer: BC Managed Care – PPO | Admitting: Advanced Practice Midwife

## 2014-08-09 VITALS — BP 110/60 | Wt 114.0 lb

## 2014-08-09 DIAGNOSIS — Z331 Pregnant state, incidental: Secondary | ICD-10-CM

## 2014-08-09 DIAGNOSIS — Z3482 Encounter for supervision of other normal pregnancy, second trimester: Secondary | ICD-10-CM

## 2014-08-09 DIAGNOSIS — Z1389 Encounter for screening for other disorder: Secondary | ICD-10-CM

## 2014-08-09 LAB — POCT URINALYSIS DIPSTICK
Blood, UA: NEGATIVE
GLUCOSE UA: NEGATIVE
Ketones, UA: NEGATIVE
Leukocytes, UA: NEGATIVE
Nitrite, UA: NEGATIVE
Protein, UA: NEGATIVE

## 2014-08-09 NOTE — Patient Instructions (Signed)
1. Before your test, do not eat or drink anything for 8-10 hours prior to your  appointment (a small amount of water is allowed and you may take any medicines you normally take). Be sure to drink lots of water the day before. 2. When you arrive, your blood will be drawn for a 'fasting' blood sugar level.  Then you will be given a sweetened carbonated beverage to drink. You should  complete drinking this beverage within five minutes. After finishing the  beverage, you will have your blood drawn exactly 1 and 2 hours later. Having  your blood drawn on time is an important part of this test. A total of three blood  samples will be done. 3. The test takes approximately 2  hours. During the test, do not have anything to  eat or drink. Do not smoke, chew gum (not even sugarless gum) or use breath mints.  4. During the test you should remain close by and seated as much as possible and  avoid walking around. You may want to bring a book or something else to  occupy your time.  5. After your test, you may eat and drink as normal. You may want to bring a snack  to eat after the test is finished. Your provider will advise you as to the results of  this test and any follow-up if necessary  You will also be retested for syphilis, HIV and blood levels (anemia):  You were already tested in the first trimester, but  recommends retesting.  Additionally, you will be tested for Type 2 Herpes. MOST people do not know that they have genital herpes, as only around 15% of people have outbreaks.  However, it is still transmittable to other people, including the baby (but only during the birth).  If you test positive for Type 2 Herpes, we place you on a medicine called acyclovir the last 6 weeks of your pregnancy to prevent transmission of the virus to the baby during the birth.    If your sugar test is positive for gestational diabetes, you will be given an phone call and further instructions discussed.   We typically do not call patients with positive herpes results, but will discuss it at your next appointment.  If you wish to know all of your test results before your next appointment, feel free to call the office, or look up your test results on Mychart.  (The range that the lab uses for normal values of the sugar test are not necessarily the range that is used for pregnant women; if your results are within the range, they are definitely normal.  However, if a value is deemed "high" by the lab, it may not be too high for a pregnant woman.  We will need to discuss the normal range if your value(s) fall in the "high" category).     Sometime between 27 and 36 weeks, it is recommended that you and anyone who is going to be in close contact with your baby receive the Tdap booster.  You should receive it EACH pregnancy, regardless of when your last booster was.  You may go to the [facility] Department (no appointment necessary) or your Primary Care office to receive the vaccine.  If you do not receive the vaccine prior to delivery, it will be offered in the hospital.  However, if you get it at least 2 weeks prior to delivery, you will have the added advantage of passing the immunity to your baby.   

## 2014-08-09 NOTE — Progress Notes (Signed)
G2P1001 384w2d Estimated Date of Delivery: 11/27/14  Blood pressure 110/60, weight 114 lb (51.71 kg), last menstrual period 01/08/2014.   BP weight and urine results all reviewed and noted.  Please refer to the obstetrical flow sheet for the fundal height and fetal heart rate documentation:  Patient reports good fetal movement, denies any bleeding and no rupture of membranes symptoms or regular contractions. Patient is without complaints. All questions were answered.  Plan:  Continued routine obstetrical care,   Follow up in 4 weeks for OB appointment, PN2

## 2014-08-09 NOTE — Progress Notes (Signed)
Pt denies any problems or concerns at this time.  

## 2014-09-06 ENCOUNTER — Encounter: Payer: Self-pay | Admitting: Advanced Practice Midwife

## 2014-09-06 ENCOUNTER — Encounter: Payer: BC Managed Care – PPO | Admitting: Advanced Practice Midwife

## 2014-09-06 ENCOUNTER — Other Ambulatory Visit: Payer: BC Managed Care – PPO

## 2014-09-11 ENCOUNTER — Ambulatory Visit (INDEPENDENT_AMBULATORY_CARE_PROVIDER_SITE_OTHER): Payer: BC Managed Care – PPO | Admitting: Women's Health

## 2014-09-11 ENCOUNTER — Encounter: Payer: Self-pay | Admitting: Women's Health

## 2014-09-11 ENCOUNTER — Other Ambulatory Visit: Payer: BC Managed Care – PPO

## 2014-09-11 VITALS — BP 98/60 | Wt 119.0 lb

## 2014-09-11 DIAGNOSIS — Z0184 Encounter for antibody response examination: Secondary | ICD-10-CM

## 2014-09-11 DIAGNOSIS — Z131 Encounter for screening for diabetes mellitus: Secondary | ICD-10-CM

## 2014-09-11 DIAGNOSIS — Z114 Encounter for screening for human immunodeficiency virus [HIV]: Secondary | ICD-10-CM

## 2014-09-11 DIAGNOSIS — Z331 Pregnant state, incidental: Secondary | ICD-10-CM

## 2014-09-11 DIAGNOSIS — Z113 Encounter for screening for infections with a predominantly sexual mode of transmission: Secondary | ICD-10-CM

## 2014-09-11 DIAGNOSIS — Z3493 Encounter for supervision of normal pregnancy, unspecified, third trimester: Secondary | ICD-10-CM

## 2014-09-11 DIAGNOSIS — Z3482 Encounter for supervision of other normal pregnancy, second trimester: Secondary | ICD-10-CM

## 2014-09-11 DIAGNOSIS — Z1389 Encounter for screening for other disorder: Secondary | ICD-10-CM

## 2014-09-11 LAB — CBC
HCT: 32.7 % — ABNORMAL LOW (ref 36.0–46.0)
HEMOGLOBIN: 10.7 g/dL — AB (ref 12.0–15.0)
MCH: 28.2 pg (ref 26.0–34.0)
MCHC: 32.7 g/dL (ref 30.0–36.0)
MCV: 86.1 fL (ref 78.0–100.0)
MPV: 10 fL (ref 9.4–12.4)
Platelets: 286 10*3/uL (ref 150–400)
RBC: 3.8 MIL/uL — AB (ref 3.87–5.11)
RDW: 14.1 % (ref 11.5–15.5)
WBC: 11 10*3/uL — ABNORMAL HIGH (ref 4.0–10.5)

## 2014-09-11 LAB — POCT URINALYSIS DIPSTICK
Blood, UA: NEGATIVE
Glucose, UA: NEGATIVE
Ketones, UA: NEGATIVE
Leukocytes, UA: NEGATIVE
NITRITE UA: NEGATIVE
PROTEIN UA: NEGATIVE

## 2014-09-11 NOTE — Progress Notes (Signed)
Low-risk OB appointment G2P1001 2551w0d Estimated Date of Delivery: 11/27/14 BP 98/60 mmHg  Wt 119 lb (53.978 kg)  LMP 01/08/2014  BP, weight, and urine reviewed.  Refer to obstetrical flow sheet for FH & FHR.  Reports good fm.  Denies regular uc's, lof, vb, or uti s/s. No complaints. Reviewed ptl s/s, fkc. Plan:  Continue routine obstetrical care  F/U in 4wks for OB appointment  PN2 today

## 2014-09-11 NOTE — Patient Instructions (Signed)

## 2014-09-12 LAB — ANTIBODY SCREEN: ANTIBODY SCREEN: NEGATIVE

## 2014-09-12 LAB — GLUCOSE TOLERANCE, 2 HOURS W/ 1HR
GLUCOSE, FASTING: 83 mg/dL (ref 70–99)
GLUCOSE: 86 mg/dL (ref 70–170)
Glucose, 2 hour: 117 mg/dL (ref 70–139)

## 2014-09-12 LAB — HIV ANTIBODY (ROUTINE TESTING W REFLEX): HIV 1&2 Ab, 4th Generation: NONREACTIVE

## 2014-09-12 LAB — RPR

## 2014-09-13 LAB — HSV 2 ANTIBODY, IGG

## 2014-09-21 NOTE — L&D Delivery Note (Signed)
Patient is 24 y.o. G2P1001 4466w0d admitted for induction of labor secondary to IUGR.   Delivery Note At 8:59 PM a healthy female was delivered via Vaginal, Spontaneous Delivery (Presentation: Left Occiput Anterior).  APGAR: 9, 9; weight. Cord loosely noted around neck, but was able to be safely removed without complication.  Placenta status: Intact, Spontaneous.  Cord: 3 vessels with the following complications: None.    Anesthesia: Epidural  Episiotomy: None Lacerations: None Suture Repair: N/A Est. Blood Loss (mL): 300  Mom to postpartum.  Baby to Couplet care / Skin to Skin.  Araceli BoucheRumley, [place] 11/20/2014, 9:37 PM

## 2014-10-03 ENCOUNTER — Inpatient Hospital Stay (HOSPITAL_COMMUNITY)
Admission: AD | Admit: 2014-10-03 | Discharge: 2014-10-03 | Disposition: A | Discharge status: Home / Self Care | Payer: BLUE CROSS/BLUE SHIELD | Source: Ambulatory Visit | Attending: Obstetrics and Gynecology | Admitting: Obstetrics and Gynecology

## 2014-10-03 ENCOUNTER — Encounter (HOSPITAL_COMMUNITY): Payer: Self-pay | Admitting: *Deleted

## 2014-10-03 DIAGNOSIS — R109 Unspecified abdominal pain: Secondary | ICD-10-CM | POA: Diagnosis not present

## 2014-10-03 DIAGNOSIS — O4703 False labor before 37 completed weeks of gestation, third trimester: Secondary | ICD-10-CM | POA: Diagnosis not present

## 2014-10-03 DIAGNOSIS — O9989 Other specified diseases and conditions complicating pregnancy, childbirth and the puerperium: Secondary | ICD-10-CM | POA: Diagnosis present

## 2014-10-03 DIAGNOSIS — E860 Dehydration: Secondary | ICD-10-CM

## 2014-10-03 DIAGNOSIS — Z3A32 32 weeks gestation of pregnancy: Secondary | ICD-10-CM | POA: Insufficient documentation

## 2014-10-03 LAB — URINALYSIS, ROUTINE W REFLEX MICROSCOPIC
Bilirubin Urine: NEGATIVE
Glucose, UA: 500 mg/dL — AB
HGB URINE DIPSTICK: NEGATIVE
Ketones, ur: 15 mg/dL — AB
Leukocytes, UA: NEGATIVE
Nitrite: NEGATIVE
PROTEIN: NEGATIVE mg/dL
Specific Gravity, Urine: >1.03 — ABNORMAL HIGH (ref 1.005–1.030)
Urobilinogen, UA: 0.2 mg/dL (ref 0.0–1.0)
pH: 6 (ref 5.0–8.0)

## 2014-10-03 NOTE — MAU Note (Signed)
C/o cramping since 1130 this AM; denies any bleeding;

## 2014-10-03 NOTE — Discharge Instructions (Signed)

## 2014-10-09 ENCOUNTER — Ambulatory Visit (INDEPENDENT_AMBULATORY_CARE_PROVIDER_SITE_OTHER): Payer: BLUE CROSS/BLUE SHIELD | Admitting: Women's Health

## 2014-10-09 ENCOUNTER — Encounter: Payer: Self-pay | Admitting: Women's Health

## 2014-10-09 VITALS — BP 98/52 | Wt 116.0 lb

## 2014-10-09 DIAGNOSIS — Z1389 Encounter for screening for other disorder: Secondary | ICD-10-CM

## 2014-10-09 DIAGNOSIS — O09293 Supervision of pregnancy with other poor reproductive or obstetric history, third trimester: Secondary | ICD-10-CM

## 2014-10-09 DIAGNOSIS — Z331 Pregnant state, incidental: Secondary | ICD-10-CM

## 2014-10-09 DIAGNOSIS — Z3493 Encounter for supervision of normal pregnancy, unspecified, third trimester: Secondary | ICD-10-CM

## 2014-10-09 LAB — POCT URINALYSIS DIPSTICK
KETONES UA: NEGATIVE
Leukocytes, UA: NEGATIVE
Nitrite, UA: NEGATIVE
Protein, UA: NEGATIVE
RBC UA: NEGATIVE

## 2014-10-09 NOTE — Progress Notes (Signed)
Low-risk OB appointment G2P1001 2164w0d Estimated Date of Delivery: 11/27/14 BP 98/52 mmHg  Wt 116 lb (52.617 kg)  LMP 01/08/2014  BP, weight, and urine reviewed.  Refer to obstetrical flow sheet for FH & FHR.  Reports good fm.  Denies regular uc's, lof, vb, or uti s/s. No complaints. [facility]/ [facility]'s, all normal.  Has only gained 6lbs entire pregnancy, FH normal. Eats 'a lot', at least 3 meals/day and snacks. Gained 10lbs last pregnancy, reports baby weighed 5lb13oz @ 38.6wks/SGA. Discussed w/ LHE, will get efw u/s.  Reviewed pn2 results, ptl s/s, fkc. Plan:  Continue routine obstetrical care, get growth u/s F/U asap for efw/afi u/s (no visit), then 2wks for OB appointment

## 2014-10-09 NOTE — Patient Instructions (Signed)
Call the office ([phone]) or go to [facility] if:  You begin to have strong, frequent contractions  Your water breaks.  Sometimes it is a big gush of fluid, sometimes it is just a trickle that keeps getting your panties wet or running down your legs  You have vaginal bleeding.  It is normal to have a small amount of spotting if your cervix was checked.   You don't feel your baby moving like normal.  If you don't, get you something to eat and drink and lay down and focus on feeling your baby move.  You should feel at least 10 movements in 2 hours.  If you don't, you should call the office or go to [facility].    Preterm Labor Information Preterm labor is when labor starts at less than 37 weeks of pregnancy. The normal length of a pregnancy is 39 to 41 weeks. CAUSES Often, there is no identifiable underlying cause as to why a woman goes into preterm labor. One of the most common known causes of preterm labor is infection. Infections of the uterus, cervix, vagina, amniotic sac, bladder, kidney, or even the lungs (pneumonia) can cause labor to start. Other suspected causes of preterm labor include:   Urogenital infections, such as yeast infections and bacterial vaginosis.   Uterine abnormalities (uterine shape, uterine septum, fibroids, or bleeding from the placenta).   A cervix that has been operated on (it may fail to stay closed).   Malformations in the fetus.   Multiple gestations (twins, triplets, and so on).   Breakage of the amniotic sac.  RISK FACTORS  Having a previous history of preterm labor.   Having premature rupture of membranes (PROM).   Having a placenta that covers the opening of the cervix (placenta previa).   Having a placenta that separates from the uterus (placental abruption).   Having a cervix that is too weak to hold the fetus in the uterus (incompetent cervix).   Having too much fluid in the amniotic sac (polyhydramnios).   Taking  illegal drugs or smoking while pregnant.   Not gaining enough weight while pregnant.   Being younger than 18 and older than 26 years old.   Having a low socioeconomic status.   Being African American. SYMPTOMS Signs and symptoms of preterm labor include:   Menstrual-like cramps, abdominal pain, or back pain.  Uterine contractions that are regular, as frequent as six in an hour, regardless of their intensity (may be mild or painful).  Contractions that start on the top of the uterus and spread down to the lower abdomen and back.   A sense of increased pelvic pressure.   A watery or bloody mucus discharge that comes from the vagina.  TREATMENT Depending on the length of the pregnancy and other circumstances, your health care provider may suggest bed rest. If necessary, there are medicines that can be given to stop contractions and to mature the fetal lungs. If labor happens before 34 weeks of pregnancy, a prolonged hospital stay may be recommended. Treatment depends on the condition of both you and the fetus.  WHAT SHOULD YOU DO IF YOU THINK YOU ARE IN PRETERM LABOR? Call your health care provider right away. You will need to go to the hospital to get checked immediately. HOW CAN YOU PREVENT PRETERM LABOR IN FUTURE PREGNANCIES? You should:   Stop smoking if you smoke.  Maintain healthy weight gain and avoid chemicals and drugs that are not necessary.  Be watchful for   any type of infection.  Inform your health care provider if you have a known history of preterm labor. Document Released: 11/28/2003 Document Revised: 05/10/2013 Document Reviewed: 10/10/2012 ExitCare Patient Information 2015 [employer]. This information is not intended to replace advice given to you by your health care provider. Make sure you discuss any questions you have with your health care provider.  

## 2014-10-15 ENCOUNTER — Other Ambulatory Visit: Payer: Self-pay | Admitting: Women's Health

## 2014-10-15 ENCOUNTER — Ambulatory Visit (INDEPENDENT_AMBULATORY_CARE_PROVIDER_SITE_OTHER): Payer: BLUE CROSS/BLUE SHIELD

## 2014-10-15 DIAGNOSIS — O09293 Supervision of pregnancy with other poor reproductive or obstetric history, third trimester: Secondary | ICD-10-CM

## 2014-10-15 DIAGNOSIS — Z8759 Personal history of other complications of pregnancy, childbirth and the puerperium: Secondary | ICD-10-CM

## 2014-10-15 DIAGNOSIS — Z8742 Personal history of other diseases of the female genital tract: Secondary | ICD-10-CM

## 2014-10-15 DIAGNOSIS — O26843 Uterine size-date discrepancy, third trimester: Secondary | ICD-10-CM

## 2014-10-15 NOTE — Progress Notes (Signed)
U/S(33+6wks)-vtx active fetus, EFW 4 lb (1821 g) (15th%tile), Amniotic Fluid WNL- AFI-7.9cm SDP of fluid = 3.9cm, FHR- 126 bpm, Cx appears closed=3.8cm, posterior/fundal Gr 1 placenta, female fetus, anatomy reviewed no major abnl noted

## 2014-10-16 ENCOUNTER — Encounter: Payer: Self-pay | Admitting: Women's Health

## 2014-10-16 DIAGNOSIS — O09299 Supervision of pregnancy with other poor reproductive or obstetric history, unspecified trimester: Secondary | ICD-10-CM | POA: Insufficient documentation

## 2014-10-23 ENCOUNTER — Encounter: Payer: Self-pay | Admitting: Women's Health

## 2014-10-23 ENCOUNTER — Ambulatory Visit (INDEPENDENT_AMBULATORY_CARE_PROVIDER_SITE_OTHER): Payer: BLUE CROSS/BLUE SHIELD | Admitting: Women's Health

## 2014-10-23 VITALS — BP 98/54 | Wt 125.0 lb

## 2014-10-23 DIAGNOSIS — Z1389 Encounter for screening for other disorder: Secondary | ICD-10-CM

## 2014-10-23 DIAGNOSIS — Z3493 Encounter for supervision of normal pregnancy, unspecified, third trimester: Secondary | ICD-10-CM

## 2014-10-23 DIAGNOSIS — Z331 Pregnant state, incidental: Secondary | ICD-10-CM

## 2014-10-23 DIAGNOSIS — O09293 Supervision of pregnancy with other poor reproductive or obstetric history, third trimester: Secondary | ICD-10-CM

## 2014-10-23 LAB — POCT URINALYSIS DIPSTICK
Ketones, UA: NEGATIVE
Leukocytes, UA: NEGATIVE
Nitrite, UA: NEGATIVE
Protein, UA: NEGATIVE
RBC UA: NEGATIVE

## 2014-10-23 NOTE — Patient Instructions (Signed)
Call the office ([phone]) or go to [facility] if:  You begin to have strong, frequent contractions  Your water breaks.  Sometimes it is a big gush of fluid, sometimes it is just a trickle that keeps getting your panties wet or running down your legs  You have vaginal bleeding.  It is normal to have a small amount of spotting if your cervix was checked.   You don't feel your baby moving like normal.  If you don't, get you something to eat and drink and lay down and focus on feeling your baby move.  You should feel at least 10 movements in 2 hours.  If you don't, you should call the office or go to [facility].    Preterm Labor Information Preterm labor is when labor starts at less than 37 weeks of pregnancy. The normal length of a pregnancy is 39 to 41 weeks. CAUSES Often, there is no identifiable underlying cause as to why a woman goes into preterm labor. One of the most common known causes of preterm labor is infection. Infections of the uterus, cervix, vagina, amniotic sac, bladder, kidney, or even the lungs (pneumonia) can cause labor to start. Other suspected causes of preterm labor include:   Urogenital infections, such as yeast infections and bacterial vaginosis.   Uterine abnormalities (uterine shape, uterine septum, fibroids, or bleeding from the placenta).   A cervix that has been operated on (it may fail to stay closed).   Malformations in the fetus.   Multiple gestations (twins, triplets, and so on).   Breakage of the amniotic sac.  RISK FACTORS  Having a previous history of preterm labor.   Having premature rupture of membranes (PROM).   Having a placenta that covers the opening of the cervix (placenta previa).   Having a placenta that separates from the uterus (placental abruption).   Having a cervix that is too weak to hold the fetus in the uterus (incompetent cervix).   Having too much fluid in the amniotic sac (polyhydramnios).   Taking  illegal drugs or smoking while pregnant.   Not gaining enough weight while pregnant.   Being younger than 18 and older than 26 years old.   Having a low socioeconomic status.   Being African American. SYMPTOMS Signs and symptoms of preterm labor include:   Menstrual-like cramps, abdominal pain, or back pain.  Uterine contractions that are regular, as frequent as six in an hour, regardless of their intensity (may be mild or painful).  Contractions that start on the top of the uterus and spread down to the lower abdomen and back.   A sense of increased pelvic pressure.   A watery or bloody mucus discharge that comes from the vagina.  TREATMENT Depending on the length of the pregnancy and other circumstances, your health care provider may suggest bed rest. If necessary, there are medicines that can be given to stop contractions and to mature the fetal lungs. If labor happens before 34 weeks of pregnancy, a prolonged hospital stay may be recommended. Treatment depends on the condition of both you and the fetus.  WHAT SHOULD YOU DO IF YOU THINK YOU ARE IN PRETERM LABOR? Call your health care provider right away. You will need to go to the hospital to get checked immediately. HOW CAN YOU PREVENT PRETERM LABOR IN FUTURE PREGNANCIES? You should:   Stop smoking if you smoke.  Maintain healthy weight gain and avoid chemicals and drugs that are not necessary.  Be watchful for   any type of infection.  Inform your health care provider if you have a known history of preterm labor. Document Released: 11/28/2003 Document Revised: 05/10/2013 Document Reviewed: 10/10/2012 ExitCare Patient Information 2015 [employer]. This information is not intended to replace advice given to you by your health care provider. Make sure you discuss any questions you have with your health care provider.  

## 2014-10-23 NOTE — Progress Notes (Signed)
Low-risk OB appointment G2P1001 8668w0d Estimated Date of Delivery: 11/27/14 BP 98/54 mmHg  Wt 125 lb (56.7 kg)  LMP 01/08/2014  BP, weight, and urine reviewed.  Refer to obstetrical flow sheet for FH & FHR.  Reports good fm.  Denies regular uc's, lof, vb, or uti s/s. No complaints. Reviewed ptl s/s, fkc. Plan:  Continue routine obstetrical care, get efw/afi u/s in 2wks for h/o IUGR and 15% on last u/s F/U in 2wks for OB appointment and u/s

## 2014-11-06 ENCOUNTER — Other Ambulatory Visit: Payer: Self-pay | Admitting: Women's Health

## 2014-11-06 ENCOUNTER — Ambulatory Visit (INDEPENDENT_AMBULATORY_CARE_PROVIDER_SITE_OTHER): Payer: BLUE CROSS/BLUE SHIELD

## 2014-11-06 ENCOUNTER — Ambulatory Visit (INDEPENDENT_AMBULATORY_CARE_PROVIDER_SITE_OTHER): Payer: BLUE CROSS/BLUE SHIELD | Admitting: Obstetrics and Gynecology

## 2014-11-06 VITALS — BP 100/70 | Wt 128.0 lb

## 2014-11-06 DIAGNOSIS — Z1389 Encounter for screening for other disorder: Secondary | ICD-10-CM

## 2014-11-06 DIAGNOSIS — Z3493 Encounter for supervision of normal pregnancy, unspecified, third trimester: Secondary | ICD-10-CM

## 2014-11-06 DIAGNOSIS — Z131 Encounter for screening for diabetes mellitus: Secondary | ICD-10-CM | POA: Diagnosis not present

## 2014-11-06 DIAGNOSIS — O365931 Maternal care for other known or suspected poor fetal growth, third trimester, fetus 1: Secondary | ICD-10-CM

## 2014-11-06 DIAGNOSIS — O26843 Uterine size-date discrepancy, third trimester: Secondary | ICD-10-CM

## 2014-11-06 DIAGNOSIS — Z331 Pregnant state, incidental: Secondary | ICD-10-CM

## 2014-11-06 DIAGNOSIS — O09293 Supervision of pregnancy with other poor reproductive or obstetric history, third trimester: Secondary | ICD-10-CM

## 2014-11-06 DIAGNOSIS — R81 Glycosuria: Secondary | ICD-10-CM

## 2014-11-06 LAB — POCT URINALYSIS DIPSTICK
Blood, UA: NEGATIVE
Glucose, UA: 3
KETONES UA: NEGATIVE
Leukocytes, UA: NEGATIVE
Nitrite, UA: NEGATIVE
Protein, UA: NEGATIVE

## 2014-11-06 LAB — US OB FOLLOW UP

## 2014-11-06 LAB — GLUCOSE, POCT (MANUAL RESULT ENTRY): POC GLUCOSE: 127 mg/dL — AB (ref 70–99)

## 2014-11-06 NOTE — Addendum Note (Signed)
Addended by: Tilda BurrowFERGUSON, Rawad Bochicchio V on: 11/06/2014 02:48 PM   Modules accepted: Orders

## 2014-11-06 NOTE — Progress Notes (Signed)
G2P1001 8569w0d Estimated Date of Delivery: 11/27/14  Blood pressure 100/70, weight 128 lb (58.06 kg), last menstrual period 01/08/2014.   refer to the ob flow sheet for FH and FHR, also BP, Wt, Urine results:notable for glucosuria after a lunch of orange juice. U/s efw 2461 at 37wk appears appropriate for pt who is 895ft 0in, and partner is five ft 4 in.Dopplers excellent Patient reports   good fetal movement, denies any bleeding and no rupture of membranes symptoms or regular contractions. Patient complaints:none  Questions were answered. Assessment Plan:  Continued routine obstetrical care, nst next wk on monday  F/u in 6 d for  pnx and NST

## 2014-11-06 NOTE — Progress Notes (Signed)
U/S(37+0wks)-vtx active fetus, EFW 5 lb 7 oz (7th%tile) Fluid WNL AFI-5+cm SDP- 2.4cm, FHR-164 bpm, UA Doppler RI-0.60 & 0.50, BPP 8/8, posterior/fundal Gr 2 placenta

## 2014-11-06 NOTE — Addendum Note (Signed)
Addended by: Richardson ChiquitoRAVIS, ASHLEY M on: 11/06/2014 02:52 PM   Modules accepted: Orders

## 2014-11-06 NOTE — Patient Instructions (Signed)

## 2014-11-07 ENCOUNTER — Encounter: Payer: Self-pay | Admitting: Women's Health

## 2014-11-07 DIAGNOSIS — O365990 Maternal care for other known or suspected poor fetal growth, unspecified trimester, not applicable or unspecified: Secondary | ICD-10-CM | POA: Insufficient documentation

## 2014-11-12 ENCOUNTER — Ambulatory Visit (INDEPENDENT_AMBULATORY_CARE_PROVIDER_SITE_OTHER): Payer: BLUE CROSS/BLUE SHIELD | Admitting: Women's Health

## 2014-11-12 ENCOUNTER — Encounter: Payer: Self-pay | Admitting: Women's Health

## 2014-11-12 VITALS — BP 102/58 | Wt 129.0 lb

## 2014-11-12 DIAGNOSIS — Z3685 Encounter for antenatal screening for Streptococcus B: Secondary | ICD-10-CM

## 2014-11-12 DIAGNOSIS — Z1389 Encounter for screening for other disorder: Secondary | ICD-10-CM

## 2014-11-12 DIAGNOSIS — Z118 Encounter for screening for other infectious and parasitic diseases: Secondary | ICD-10-CM

## 2014-11-12 DIAGNOSIS — O09893 Supervision of other high risk pregnancies, third trimester: Secondary | ICD-10-CM

## 2014-11-12 DIAGNOSIS — Z331 Pregnant state, incidental: Secondary | ICD-10-CM

## 2014-11-12 DIAGNOSIS — O365931 Maternal care for other known or suspected poor fetal growth, third trimester, fetus 1: Secondary | ICD-10-CM

## 2014-11-12 DIAGNOSIS — Z36 Encounter for antenatal screening of mother: Secondary | ICD-10-CM

## 2014-11-12 DIAGNOSIS — Z1159 Encounter for screening for other viral diseases: Secondary | ICD-10-CM

## 2014-11-12 LAB — POCT URINALYSIS DIPSTICK
Blood, UA: NEGATIVE
Glucose, UA: NEGATIVE
Ketones, UA: NEGATIVE
Leukocytes, UA: NEGATIVE
Nitrite, UA: NEGATIVE
Protein, UA: NEGATIVE

## 2014-11-12 LAB — OB RESULTS CONSOLE GBS: GBS: POSITIVE

## 2014-11-12 LAB — OB RESULTS CONSOLE GC/CHLAMYDIA
Chlamydia: NEGATIVE
Gonorrhea: NEGATIVE

## 2014-11-12 NOTE — Progress Notes (Signed)
High Risk Pregnancy Diagnosis(es): IUGR G2P1001 6916w6d Estimated Date of Delivery: 11/27/14 BP 102/58 mmHg  Wt 129 lb (58.514 kg)  LMP 01/08/2014  Urinalysis: Negative HPI:  Doing well BP, weight, and urine reviewed.  Reports good fm. Denies regular uc's, lof, vb, uti s/s. No complaints.  Fundal Height:  33cm Fetal Heart rate:  145, reactive nst Edema:  Trace GBS collected SVE per request: 1/20/-2, vtx  Reviewed labor s/s, fkc All questions were answered Assessment: 2016w6d IUGR Medication(s) Plans:  n/a Treatment Plan:  2x/wk testing nst/sono, IOL @ 39wks Follow up in 3d for high-risk OB appt and bpp u/s

## 2014-11-12 NOTE — Patient Instructions (Signed)
Call the office ([phone]) or go to [facility] if:  You begin to have strong, frequent contractions  Your water breaks.  Sometimes it is a big gush of fluid, sometimes it is just a trickle that keeps getting your panties wet or running down your legs  You have vaginal bleeding.  It is normal to have a small amount of spotting if your cervix was checked.   You don't feel your baby moving like normal.  If you don't, get you something to eat and drink and lay down and focus on feeling your baby move.  You should feel at least 10 movements in 2 hours.  If you don't, you should call the office or go to [facility].    Braxton Hicks Contractions Contractions of the uterus can occur throughout pregnancy. Contractions are not always a sign that you are in labor.  WHAT ARE BRAXTON HICKS CONTRACTIONS?  Contractions that occur before labor are called Braxton Hicks contractions, or false labor. Toward the end of pregnancy (32-34 weeks), these contractions can develop more often and may become more forceful. This is not true labor because these contractions do not result in opening (dilatation) and thinning of the cervix. They are sometimes difficult to tell apart from true labor because these contractions can be forceful and people have different pain tolerances. You should not feel embarrassed if you go to the hospital with false labor. Sometimes, the only way to tell if you are in true labor is for your health care provider to look for changes in the cervix. If there are no prenatal problems or other health problems associated with the pregnancy, it is completely safe to be sent home with false labor and await the onset of true labor. HOW CAN YOU TELL THE DIFFERENCE BETWEEN TRUE AND FALSE LABOR? False Labor  The contractions of false labor are usually shorter and not as hard as those of true labor.   The contractions are usually irregular.   The contractions are often felt in the front of  the lower abdomen and in the groin.   The contractions may go away when you walk around or change positions while lying down.   The contractions get weaker and are shorter lasting as time goes on.   The contractions do not usually become progressively stronger, regular, and closer together as with true labor.  True Labor  Contractions in true labor last 30-70 seconds, become very regular, usually become more intense, and increase in frequency.   The contractions do not go away with walking.   The discomfort is usually felt in the top of the uterus and spreads to the lower abdomen and low back.   True labor can be determined by your health care provider with an exam. This will show that the cervix is dilating and getting thinner.  WHAT TO REMEMBER  Keep up with your usual exercises and follow other instructions given by your health care provider.   Take medicines as directed by your health care provider.   Keep your regular prenatal appointments.   Eat and drink lightly if you think you are going into labor.   If Braxton Hicks contractions are making you uncomfortable:   Change your position from lying down or resting to walking, or from walking to resting.   Sit and rest in a tub of warm water.   Drink 2-3 glasses of water. Dehydration may cause these contractions.   Do slow and deep breathing several times an hour.    WHEN SHOULD I SEEK IMMEDIATE MEDICAL CARE? Seek immediate medical care if:  Your contractions become stronger, more regular, and closer together.   You have fluid leaking or gushing from your vagina.   You have a fever.   You pass blood-tinged mucus.   You have vaginal bleeding.   You have continuous abdominal pain.   You have low back pain that you never had before.   You feel your baby's head pushing down and causing pelvic pressure.   Your baby is not moving as much as it used to.  Document Released: 09/07/2005 Document  Revised: 09/12/2013 Document Reviewed: 06/19/2013 ExitCare Patient Information 2015 [employer]. This information is not intended to replace advice given to you by your health care provider. Make sure you discuss any questions you have with your health care provider.  

## 2014-11-14 LAB — GC/CHLAMYDIA PROBE AMP
Chlamydia trachomatis, NAA: NEGATIVE
NEISSERIA GONORRHOEAE BY PCR: NEGATIVE

## 2014-11-15 ENCOUNTER — Encounter: Payer: Self-pay | Admitting: Obstetrics & Gynecology

## 2014-11-15 ENCOUNTER — Ambulatory Visit (INDEPENDENT_AMBULATORY_CARE_PROVIDER_SITE_OTHER): Payer: BLUE CROSS/BLUE SHIELD

## 2014-11-15 ENCOUNTER — Other Ambulatory Visit: Payer: Self-pay | Admitting: Women's Health

## 2014-11-15 ENCOUNTER — Ambulatory Visit (INDEPENDENT_AMBULATORY_CARE_PROVIDER_SITE_OTHER): Payer: BLUE CROSS/BLUE SHIELD | Admitting: Obstetrics & Gynecology

## 2014-11-15 VITALS — BP 100/60 | HR 72 | Wt 130.0 lb

## 2014-11-15 DIAGNOSIS — O365931 Maternal care for other known or suspected poor fetal growth, third trimester, fetus 1: Secondary | ICD-10-CM

## 2014-11-15 DIAGNOSIS — O09893 Supervision of other high risk pregnancies, third trimester: Secondary | ICD-10-CM

## 2014-11-15 DIAGNOSIS — Z1389 Encounter for screening for other disorder: Secondary | ICD-10-CM

## 2014-11-15 DIAGNOSIS — Z331 Pregnant state, incidental: Secondary | ICD-10-CM

## 2014-11-15 LAB — POCT URINALYSIS DIPSTICK
Blood, UA: NEGATIVE
Glucose, UA: NEGATIVE
Ketones, UA: NEGATIVE
Leukocytes, UA: NEGATIVE
NITRITE UA: NEGATIVE
Protein, UA: NEGATIVE

## 2014-11-15 NOTE — Progress Notes (Signed)
U/S(38+2wks)-vtx active fetus,BPP 8/8, fluid WNL AFI-6.1cm SDP- 2.3cm, FHR-169 bpm, UA DOppler RI-0.52 & 0.54

## 2014-11-15 NOTE — Progress Notes (Signed)
Fetal Surveillance Testing today:  Sonogram is reviewed read and report done, fluid adequate   High Risk Pregnancy Diagnosis(es):   IUGR  G2P1001 459w2d Estimated Date of Delivery: 11/27/14  Blood pressure 100/60, pulse 72, weight 130 lb (58.968 kg), last menstrual period 01/08/2014.  Urinalysis: Negative   HPI: The patient is being seen today for ongoing management of IUGR Today she reports irregular contrctions   BP weight and urine results all reviewed and noted. Patient reports good fetal movement, denies any bleeding and no rupture of membranes symptoms or regular contractions.  Fundal Height:  34 Fetal Heart rate:  138 Edema:  none  Patient is without complaints other than noted in her HPI. All questions were answered.  All lab and sonogram results have been reviewed. Comments:sonogram is reassuring  Assessment:  1.  Pregnancy at 389w2d,  Estimated Date of Delivery: 11/27/14 :                          2.  IUGR                        3.    Medication(s) Plans:  No changes  Treatment Plan:  Induction next week 11/20/2014  Follow up in 6 weeks for appointment for high risk OB care, pp exam

## 2014-11-16 LAB — CULTURE, BETA STREP (GROUP B ONLY): STREP GP B CULTURE: NEGATIVE

## 2014-11-19 ENCOUNTER — Telehealth (HOSPITAL_COMMUNITY): Payer: Self-pay | Admitting: *Deleted

## 2014-11-19 NOTE — Telephone Encounter (Signed)
Preadmission screen  

## 2014-11-20 ENCOUNTER — Encounter (HOSPITAL_COMMUNITY): Payer: Self-pay

## 2014-11-20 ENCOUNTER — Inpatient Hospital Stay (HOSPITAL_COMMUNITY): Payer: BLUE CROSS/BLUE SHIELD | Admitting: Anesthesiology

## 2014-11-20 ENCOUNTER — Inpatient Hospital Stay (HOSPITAL_COMMUNITY)
Admission: RE | Admit: 2014-11-20 | Discharge: 2014-11-22 | DRG: 775 | Disposition: A | Discharge status: Home / Self Care | Payer: BLUE CROSS/BLUE SHIELD | Source: Ambulatory Visit | Attending: Family Medicine | Admitting: Family Medicine

## 2014-11-20 DIAGNOSIS — O99824 Streptococcus B carrier state complicating childbirth: Secondary | ICD-10-CM

## 2014-11-20 DIAGNOSIS — Z3490 Encounter for supervision of normal pregnancy, unspecified, unspecified trimester: Secondary | ICD-10-CM

## 2014-11-20 DIAGNOSIS — Z3A39 39 weeks gestation of pregnancy: Secondary | ICD-10-CM | POA: Diagnosis present

## 2014-11-20 DIAGNOSIS — O365930 Maternal care for other known or suspected poor fetal growth, third trimester, not applicable or unspecified: Secondary | ICD-10-CM

## 2014-11-20 LAB — CBC
HCT: 34.2 % — ABNORMAL LOW (ref 36.0–46.0)
HEMOGLOBIN: 11.3 g/dL — AB (ref 12.0–15.0)
MCH: 28.2 pg (ref 26.0–34.0)
MCHC: 33 g/dL (ref 30.0–36.0)
MCV: 85.3 fL (ref 78.0–100.0)
PLATELETS: 268 10*3/uL (ref 150–400)
RBC: 4.01 MIL/uL (ref 3.87–5.11)
RDW: 15.8 % — ABNORMAL HIGH (ref 11.5–15.5)
WBC: 10.9 10*3/uL — AB (ref 4.0–10.5)

## 2014-11-20 LAB — TYPE AND SCREEN
ABO/RH(D): A POS
Antibody Screen: NEGATIVE

## 2014-11-20 LAB — ABO/RH: ABO/RH(D): A POS

## 2014-11-20 LAB — RPR: RPR Ser Ql: NONREACTIVE

## 2014-11-20 MED ORDER — PHENYLEPHRINE 40 MCG/ML (10ML) SYRINGE FOR IV PUSH (FOR BLOOD PRESSURE SUPPORT)
80.0000 ug | PREFILLED_SYRINGE | INTRAVENOUS | Status: DC | PRN
  Filled 2014-11-20: qty 2

## 2014-11-20 MED ORDER — DIPHENHYDRAMINE HCL 25 MG PO CAPS: 25.0000 mg | ORAL_CAPSULE | Freq: Four times a day (QID) | ORAL | Status: DC | PRN

## 2014-11-20 MED ORDER — FENTANYL CITRATE 0.05 MG/ML IJ SOLN
100.0000 ug | INTRAMUSCULAR | Status: DC | PRN
  Administered 2014-11-20 (×4): 100 ug via INTRAVENOUS
  Filled 2014-11-20 (×4): qty 2

## 2014-11-20 MED ORDER — PENICILLIN G POTASSIUM 5000000 UNITS IJ SOLR
2.5000 10*6.[IU] | INTRAVENOUS | Status: DC
  Administered 2014-11-20 (×2): 2.5 10*6.[IU] via INTRAVENOUS
  Filled 2014-11-20 (×5): qty 2.5

## 2014-11-20 MED ORDER — MISOPROSTOL 200 MCG PO TABS
ORAL_TABLET | ORAL | Status: AC
  Filled 2014-11-20: qty 5

## 2014-11-20 MED ORDER — IBUPROFEN 600 MG PO TABS
600.0000 mg | ORAL_TABLET | Freq: Four times a day (QID) | ORAL | Status: DC
  Administered 2014-11-21 – 2014-11-22 (×6): 600 mg via ORAL
  Filled 2014-11-20 (×7): qty 1

## 2014-11-20 MED ORDER — LACTATED RINGERS IV SOLN
INTRAVENOUS | Status: DC
  Administered 2014-11-20 (×3): via INTRAVENOUS

## 2014-11-20 MED ORDER — TERBUTALINE SULFATE 1 MG/ML IJ SOLN
0.2500 mg | Freq: Once | INTRAMUSCULAR | Status: DC | PRN
  Filled 2014-11-20: qty 1

## 2014-11-20 MED ORDER — PRENATAL MULTIVITAMIN CH
1.0000 | ORAL_TABLET | Freq: Every day | ORAL | Status: DC
Start: 2014-11-21 — End: 2014-11-22
  Administered 2014-11-21: 1 via ORAL
  Filled 2014-11-20: qty 1

## 2014-11-20 MED ORDER — ONDANSETRON HCL 4 MG/2ML IJ SOLN
4.0000 mg | Freq: Four times a day (QID) | INTRAMUSCULAR | Status: DC | PRN
  Administered 2014-11-20: 4 mg via INTRAVENOUS
  Filled 2014-11-20: qty 2

## 2014-11-20 MED ORDER — FENTANYL 2.5 MCG/ML BUPIVACAINE 1/10 % EPIDURAL INFUSION (WH - ANES)
INTRAMUSCULAR | Status: DC | PRN
  Administered 2014-11-20: 12 mL/h via EPIDURAL

## 2014-11-20 MED ORDER — SIMETHICONE 80 MG PO CHEW: 80.0000 mg | CHEWABLE_TABLET | ORAL | Status: DC | PRN

## 2014-11-20 MED ORDER — LANOLIN HYDROUS EX OINT: TOPICAL_OINTMENT | CUTANEOUS | Status: DC | PRN

## 2014-11-20 MED ORDER — EPHEDRINE 5 MG/ML INJ
10.0000 mg | INTRAVENOUS | Status: DC | PRN
  Filled 2014-11-20: qty 2

## 2014-11-20 MED ORDER — SENNOSIDES-DOCUSATE SODIUM 8.6-50 MG PO TABS
2.0000 | ORAL_TABLET | ORAL | Status: DC
  Administered 2014-11-21 (×2): 2 via ORAL
  Filled 2014-11-20 (×2): qty 2

## 2014-11-20 MED ORDER — LACTATED RINGERS IV SOLN: 500.0000 mL | Freq: Once | INTRAVENOUS | Status: DC

## 2014-11-20 MED ORDER — OXYTOCIN 40 UNITS IN LACTATED RINGERS INFUSION - SIMPLE MED
1.0000 m[IU]/min | INTRAVENOUS | Status: DC
  Administered 2014-11-20: 2 m[IU]/min via INTRAVENOUS

## 2014-11-20 MED ORDER — TETANUS-DIPHTH-ACELL PERTUSSIS 5-2.5-18.5 LF-MCG/0.5 IM SUSP
0.5000 mL | Freq: Once | INTRAMUSCULAR | Status: AC
  Administered 2014-11-21: 0.5 mL via INTRAMUSCULAR
  Filled 2014-11-20: qty 0.5

## 2014-11-20 MED ORDER — ONDANSETRON HCL 4 MG PO TABS: 4.0000 mg | ORAL_TABLET | ORAL | Status: DC | PRN

## 2014-11-20 MED ORDER — PHENYLEPHRINE 40 MCG/ML (10ML) SYRINGE FOR IV PUSH (FOR BLOOD PRESSURE SUPPORT)
PREFILLED_SYRINGE | INTRAVENOUS | Status: AC
  Filled 2014-11-20: qty 20

## 2014-11-20 MED ORDER — OXYTOCIN BOLUS FROM INFUSION
500.0000 mL | INTRAVENOUS | Status: DC
  Administered 2014-11-20: 500 mL via INTRAVENOUS

## 2014-11-20 MED ORDER — BENZOCAINE-MENTHOL 20-0.5 % EX AERO
1.0000 "application " | INHALATION_SPRAY | CUTANEOUS | Status: DC | PRN
  Administered 2014-11-21: 1 via TOPICAL
  Filled 2014-11-20: qty 56

## 2014-11-20 MED ORDER — WITCH HAZEL-GLYCERIN EX PADS: 1.0000 "application " | MEDICATED_PAD | CUTANEOUS | Status: DC | PRN

## 2014-11-20 MED ORDER — LIDOCAINE HCL (PF) 1 % IJ SOLN
30.0000 mL | INTRAMUSCULAR | Status: DC | PRN
  Filled 2014-11-20: qty 30

## 2014-11-20 MED ORDER — FLEET ENEMA 7-19 GM/118ML RE ENEM: 1.0000 | ENEMA | RECTAL | Status: DC | PRN

## 2014-11-20 MED ORDER — LACTATED RINGERS IV SOLN
500.0000 mL | INTRAVENOUS | Status: DC | PRN
  Administered 2014-11-20: 500 mL via INTRAVENOUS

## 2014-11-20 MED ORDER — ONDANSETRON HCL 4 MG/2ML IJ SOLN: 4.0000 mg | INTRAMUSCULAR | Status: DC | PRN

## 2014-11-20 MED ORDER — FENTANYL 2.5 MCG/ML BUPIVACAINE 1/10 % EPIDURAL INFUSION (WH - ANES)
14.0000 mL/h | INTRAMUSCULAR | Status: DC | PRN
  Administered 2014-11-20: 14 mL/h via EPIDURAL

## 2014-11-20 MED ORDER — DIBUCAINE 1 % RE OINT: 1.0000 "application " | TOPICAL_OINTMENT | RECTAL | Status: DC | PRN

## 2014-11-20 MED ORDER — OXYCODONE-ACETAMINOPHEN 5-325 MG PO TABS: 1.0000 | ORAL_TABLET | ORAL | Status: DC | PRN

## 2014-11-20 MED ORDER — OXYTOCIN 40 UNITS IN LACTATED RINGERS INFUSION - SIMPLE MED
62.5000 mL/h | INTRAVENOUS | Status: DC
  Administered 2014-11-20: 62.5 mL/h via INTRAVENOUS
  Filled 2014-11-20: qty 1000

## 2014-11-20 MED ORDER — FENTANYL 2.5 MCG/ML BUPIVACAINE 1/10 % EPIDURAL INFUSION (WH - ANES)
INTRAMUSCULAR | Status: AC
  Administered 2014-11-20: 14 mL/h via EPIDURAL
  Filled 2014-11-20: qty 125

## 2014-11-20 MED ORDER — CITRIC ACID-SODIUM CITRATE 334-500 MG/5ML PO SOLN
30.0000 mL | ORAL | Status: DC | PRN
Start: 2014-11-20 — End: 2014-11-20

## 2014-11-20 MED ORDER — ACETAMINOPHEN 325 MG PO TABS: 650.0000 mg | ORAL_TABLET | ORAL | Status: DC | PRN

## 2014-11-20 MED ORDER — PENICILLIN G POTASSIUM 5000000 UNITS IJ SOLR
5.0000 10*6.[IU] | Freq: Once | INTRAVENOUS | Status: AC
  Administered 2014-11-20: 5 10*6.[IU] via INTRAVENOUS
  Filled 2014-11-20: qty 5

## 2014-11-20 MED ORDER — OXYCODONE-ACETAMINOPHEN 5-325 MG PO TABS: 2.0000 | ORAL_TABLET | ORAL | Status: DC | PRN

## 2014-11-20 MED ORDER — ZOLPIDEM TARTRATE 5 MG PO TABS: 5.0000 mg | ORAL_TABLET | Freq: Every evening | ORAL | Status: DC | PRN

## 2014-11-20 MED ORDER — DIPHENHYDRAMINE HCL 50 MG/ML IJ SOLN: 12.5000 mg | INTRAMUSCULAR | Status: DC | PRN

## 2014-11-20 MED ORDER — LIDOCAINE HCL (PF) 1 % IJ SOLN
INTRAMUSCULAR | Status: DC | PRN
  Administered 2014-11-20 (×2): 4 mL

## 2014-11-20 NOTE — H&P (Signed)
Carly ProwsKaren Kurkowski is a 24 y.o. female presenting for Induction of Labor for IUGR. [facility] and has been noted to have some Growth Restriction since 33 weeks. She was initially 15%ile then 7%ile where she has remained. Has been on twice weekly testing.     Maternal Medical History:  Reason for admission: Nausea.  Contractions: Frequency: rare.   Perceived severity is mild.    Fetal activity: Perceived fetal activity is normal.   Last perceived fetal movement was within the past hour.    Prenatal complications: IUGR.   No bleeding.   Prenatal Complications - Diabetes: none.    OB History    Gravida Para Term Preterm AB TAB SAB Ectopic Multiple Living   2 1 1       1      Past Medical History  Diagnosis Date  . Medical history non-contributory    Past Surgical History  Procedure Laterality Date  . No past surgeries     Family History: family history includes Diabetes in her maternal grandmother. Social History:  reports that she has never smoked. She has never used smokeless tobacco. She reports that she does not drink alcohol or use illicit drugs.   Prenatal Transfer Tool  Maternal Diabetes: No Genetic Screening: Declined Maternal Ultrasounds/Referrals: Normal, female Fetal Ultrasounds or other Referrals:  None Maternal Substance Abuse:  No Significant Maternal Medications:  None Significant Maternal Lab Results:  Lab values include: Group B Strep positive Other Comments:  None  Review of Systems  Constitutional: Negative for fever, chills and malaise/fatigue.  Gastrointestinal: Negative for nausea, vomiting and abdominal pain.  Genitourinary: Negative for dysuria.  Neurological: Negative for dizziness and headaches.    Dilation: 1.5 Effacement (%): 80 Station: -2 Exam by:: Artelia LarocheM. Marielle Mantione CNM Blood pressure 104/62, pulse 83, temperature 98.3 F (36.8 C), temperature source Oral, resp. rate 18, height 5' (1.524 m), weight 130 lb (58.968 kg), last  menstrual period 01/08/2014. Maternal Exam:  Uterine Assessment: Contraction strength is mild.  Contraction frequency is rare.   Abdomen: Patient reports no abdominal tenderness. Fundal height is 36.   Estimated fetal weight is 6.   Fetal presentation: vertex  Introitus: Normal vulva. Vagina is positive for vaginal discharge.  Ferning test: not done.  Nitrazine test: not done. Amniotic fluid character: clear.  Pelvis: adequate for delivery.   Cervix: Cervix evaluated by digital exam.     Fetal Exam Fetal Monitor Review: Mode: ultrasound.   Baseline rate: 140.  Variability: moderate (6-25 bpm).   Pattern: accelerations present and no decelerations.    Fetal State Assessment: Category I - tracings are normal.     Physical Exam  Constitutional: She is oriented to person, place, and time. She appears well-developed and well-nourished. No distress.  HENT:  Head: Normocephalic.  Cardiovascular: Normal rate, regular rhythm and normal heart sounds.  Exam reveals no gallop and no friction rub.   No murmur heard. Respiratory: Effort normal and breath sounds normal. No respiratory distress. She has no wheezes. She has no rales. She exhibits no tenderness.  GI: Soft. She exhibits no distension. There is no tenderness. There is no rebound and no guarding.  Genitourinary: Vaginal discharge found.  Musculoskeletal: Normal range of motion.  Neurological: She is alert and oriented to person, place, and time.  Skin: Skin is warm and dry.  Psychiatric: She has a normal mood and affect.    Prenatal labs: ABO, Rh: --/--/A POS (03/01 16100735) Antibody: NEG (03/01 0735) Rubella: 6.16 (  07/20 1505) RPR: NON REAC (12/22 0920)  HBsAg: NEGATIVE (07/20 1505)  HIV: NONREACTIVE (12/22 0920)  GBS: Positive (02/22 0000)   Assessment/Plan: A;  SIUP at [redacted]w[redacted]d        IUGR       P:  Admit to [facility]       Routine orders       Induction of labor       Foley inserted with inadvertent AROM        Will start Pitocin per protocol   [facility] 11/20/2014, 9:57 AM

## 2014-11-20 NOTE — Progress Notes (Signed)
Patient ID: Carly ProwsKaren Harris, female   DOB: Apr 16, 1991, 24 y.o.   MRN: [id number] Doing well, but requested IV pain med  Filed Vitals:   11/20/14 1130 11/20/14 1200 11/20/14 1230 11/20/14 1300  BP: 114/71 102/62 110/65 109/68  Pulse: 74 88 70 69  Temp:  98.2 F (36.8 C)    TempSrc:  Oral    Resp:  20    Height:      Weight:       FHR reactive, 130s with accels Category I UCs every 2 min  Dilation: 4 Effacement (%): 70 Cervical Position: Middle Station: -2 Presentation: Vertex Exam by:: D. brown RNC   Some contractions too close together, Pitocin decreased.

## 2014-11-20 NOTE — Anesthesia Preprocedure Evaluation (Signed)
Anesthesia Evaluation  Patient identified by MRN, date of birth, ID band Patient awake    Reviewed: Allergy & Precautions, NPO status , Patient's Chart, lab work & pertinent test results  History of Anesthesia Complications Negative for: history of anesthetic complications  Airway Mallampati: II  TM Distance: >3 FB Neck ROM: Full    Dental no notable dental hx. (+) Dental Advisory Given   Pulmonary neg pulmonary ROS,  breath sounds clear to auscultation  Pulmonary exam normal       Cardiovascular negative cardio ROS  Rhythm:Regular Rate:Normal     Neuro/Psych negative neurological ROS  negative psych ROS   GI/Hepatic negative GI ROS, Neg liver ROS,   Endo/Other  negative endocrine ROS  Renal/GU negative Renal ROS  negative genitourinary   Musculoskeletal negative musculoskeletal ROS (+)   Abdominal   Peds negative pediatric ROS (+)  Hematology negative hematology ROS (+)   Anesthesia Other Findings   Reproductive/Obstetrics (+) Pregnancy                             Anesthesia Physical Anesthesia Plan  ASA: II  Anesthesia Plan: Epidural   Post-op Pain Management:    Induction:   Airway Management Planned:   Additional Equipment:   Intra-op Plan:   Post-operative Plan:   Informed Consent: I have reviewed the patients History and Physical, chart, labs and discussed the procedure including the risks, benefits and alternatives for the proposed anesthesia with the patient or authorized representative who has indicated his/her understanding and acceptance.   Dental advisory given  Plan Discussed with: CRNA  Anesthesia Plan Comments:         Anesthesia Quick Evaluation  

## 2014-11-20 NOTE — Anesthesia Procedure Notes (Signed)
Epidural Patient location during procedure: OB Start time: 11/20/2014 6:04 PM  Staffing Anesthesiologist: Felipe DroneJUDD, Reece Mcbroom JENNETTE Performed by: anesthesiologist   Preanesthetic Checklist Completed: patient identified, site marked, surgical consent, pre-op evaluation, timeout performed, IV checked, risks and benefits discussed and monitors and equipment checked  Epidural Patient position: sitting Prep: site prepped and draped and DuraPrep Patient monitoring: continuous pulse ox and blood pressure Approach: midline Location: L3-L4 Injection technique: LOR saline  Needle:  Needle type: Tuohy  Needle gauge: 17 G Needle length: 9 cm and 9 Needle insertion depth: 5 cm cm Catheter type: closed end flexible Catheter size: 19 Gauge Catheter at skin depth: 10 cm Test dose: negative  Assessment Events: blood not aspirated, injection not painful, no injection resistance, negative IV test and no paresthesia  Additional Notes Patient identified. Risks/Benefits/Options discussed with patient including but not limited to bleeding, infection, nerve damage, paralysis, failed block, incomplete pain control, headache, blood pressure changes, nausea, vomiting, reactions to medication both or allergic, itching and postpartum back pain. Confirmed with bedside nurse the patient's most recent platelet count. Confirmed with patient that they are not currently taking any anticoagulation, have any bleeding history or any family history of bleeding disorders. Patient expressed understanding and wished to proceed. All questions were answered. Sterile technique was used throughout the entire procedure. Please see nursing notes for vital signs. Test dose was given through epidural catheter and negative prior to continuing to dose epidural or start infusion. Warning signs of high block given to the patient including shortness of breath, tingling/numbness in hands, complete motor block, or any concerning symptoms with  instructions to call for help. Patient was given instructions on fall risk and not to get out of bed. All questions and concerns addressed with instructions to call with any issues or inadequate analgesia.

## 2014-11-21 LAB — CBC
HEMATOCRIT: 27.2 % — AB (ref 36.0–46.0)
Hemoglobin: 9 g/dL — ABNORMAL LOW (ref 12.0–15.0)
MCH: 28 pg (ref 26.0–34.0)
MCHC: 33.1 g/dL (ref 30.0–36.0)
MCV: 84.7 fL (ref 78.0–100.0)
PLATELETS: 224 10*3/uL (ref 150–400)
RBC: 3.21 MIL/uL — ABNORMAL LOW (ref 3.87–5.11)
RDW: 16 % — AB (ref 11.5–15.5)
WBC: 19 10*3/uL — ABNORMAL HIGH (ref 4.0–10.5)

## 2014-11-21 LAB — HIV ANTIBODY (ROUTINE TESTING W REFLEX): HIV SCREEN 4TH GENERATION: NONREACTIVE

## 2014-11-21 MED ORDER — OXYCODONE-ACETAMINOPHEN 5-325 MG PO TABS
1.0000 | ORAL_TABLET | ORAL | Status: DC | PRN
  Administered 2014-11-21 – 2014-11-22 (×4): 1 via ORAL
  Filled 2014-11-21 (×4): qty 1

## 2014-11-21 NOTE — Lactation Note (Signed)
This note was copied from the chart of Carly Carlyon ProwsKaren Schlabach. Lactation Consultation Note  Patient Name: Carly Carlyon ProwsKaren Grabe Today's Date: 11/21/2014 Reason for consult: Initial assessment (mom aware to call for feeding assesment on nurses light )  Baby has been to the breast several times . Baby is 4416 hours old and presently being held by family sleeping. LC discussed importance of feeding assessment and asked mom to call on nurses light. Per mom had milk supply issues and had to supplement at 1 month out . Also had problems with engorgement. Mother informed of post-discharge support and given phone number to the lactation department, including services for phone  call assistance; out-patient appointments; and breastfeeding support group. List of other breastfeeding resources in the community  given in the handout. Encouraged mother to call for problems or concerns related to breastfeeding.    Maternal Data Does the patient have breastfeeding experience prior to this delivery?: Yes  Feeding Feeding Type:  (last fed ta 1240 for 15 mins )  LATCH Score/Interventions                Intervention(s): Breastfeeding basics reviewed     Lactation Tools Discussed/Used WIC Program: Yes [place] )   Consult Status Consult Status: Follow-up Date: 11/21/14 Follow-up type: In-patient    Kathrin Greathouseorio, Yvon Mccord Ann 11/21/2014, 2:00 PM

## 2014-11-21 NOTE — Anesthesia Postprocedure Evaluation (Signed)
  Anesthesia Post-op Note  Patient: Carly Harris  Procedure(s) Performed: * No procedures listed *  Patient Location: Mother/Baby  Anesthesia Type:Epidural  Level of Consciousness: awake, alert , oriented and patient cooperative  Airway and Oxygen Therapy: Patient Spontanous Breathing  Post-op Pain: none  Post-op Assessment: Post-op Vital signs reviewed, Patient's Cardiovascular Status Stable, Respiratory Function Stable, Patent Airway, No headache, No backache, No residual numbness and No residual motor weakness  Post-op Vital Signs: Reviewed and stable  Last Vitals:  Filed Vitals:   11/21/14 0400  BP: 87/50  Pulse: 87  Temp: 36.8 C  Resp: 18    Complications: No apparent anesthesia complications

## 2014-11-21 NOTE — Progress Notes (Signed)
Post Partum Day #1 Subjective:  Carly Harris is a 24 y.o. [id number] 3152w0d s/p SVD following IOL for IUGR.  No acute events overnight.  Pt denies problems with ambulating, voiding or po intake.  She denies nausea or vomiting.  Pain is well controlled.  She has had flatus. She has not had bowel movement.  Lochia Minimal.  Plan for birth control is condoms.  Method of Feeding: Breast and Bottle  Objective: Blood pressure 87/50, pulse 87, temperature 98.2 F (36.8 C), temperature source Oral, resp. rate 18, height 5' (1.524 m), weight 58.968 kg (130 lb), last menstrual period 01/08/2014, SpO2 100 %, unknown if currently breastfeeding.  Physical Exam:  General: alert, cooperative and no distress Lochia:normal flow Chest: No increased work of breathing Abdomen: soft, nontender,  Uterine Fundus: firm Extremities: No edema   Recent Labs  11/20/14 0735 11/21/14 0600  HGB 11.3* 9.0*  HCT 34.2* 27.2*    Assessment/Plan:  ASSESSMENT: Carly Harris is a 24 y.o. G2P2002 3252w0d s/p SVD following IOL for IUGR  Plan for discharge tomorrow, Breastfeeding and Lactation consult   LOS: 1 day   Araceli BoucheRumley, Delia N 11/21/2014, 9:06 AM

## 2014-11-22 ENCOUNTER — Other Ambulatory Visit: Payer: BLUE CROSS/BLUE SHIELD

## 2014-11-22 MED ORDER — IBUPROFEN 600 MG PO TABS: 600.0000 mg | ORAL_TABLET | Freq: Four times a day (QID) | ORAL | Status: AC

## 2014-11-22 NOTE — Discharge Summary (Signed)
Obstetric Discharge Summary Reason for Admission: induction of labor secondary to IUGR Prenatal Procedures: None Intrapartum Procedures: spontaneous vaginal delivery Postpartum Procedures: none Complications-Operative and Postpartum: none  Delivery Note At 8:59 PM a healthy female was delivered via Vaginal, Spontaneous Delivery (Presentation: Left Occiput Anterior).  APGAR: 9, 9; weight 5 lb 13 oz (2635 g).   Placenta status: Intact, Spontaneous.  Cord: 3 vessels with the following complications: None.    Anesthesia: Epidural  Episiotomy: None Lacerations: None Suture Repair: N/A Est. Blood Loss (mL): 300  Mom to postpartum.  Baby to Couplet care / Skin to Skin.  Hospital Course:  Active Problems:   Pregnancy   Carly Harris is a 24 y.o. [id number] s/p SVD. Patient admitted to L&D.  She has postpartum course that was uncomplicated including no problems with ambulating, PO intake, urination, pain, or bleeding. The pt feels ready to go home and  will be discharged with outpatient follow-up.   Today: No acute events overnight.  Pt denies problems with ambulating, voiding or po intake.  She denies nausea or vomiting.  Pain is well controlled.  She has had flatus. She has not had bowel movement.  Lochia Minimal.  Plan for birth control is  condoms.  Method of Feeding: breast and bottle  H/H: Lab Results  Component Value Date/Time   HGB 9.0* 11/21/2014 06:00 AM   HGB 12.8 02/06/2014 11:32 AM   HCT 27.2* 11/21/2014 06:00 AM   HCT 40.6 02/06/2014 11:32 AM    Discharge Diagnoses: Term Pregnancy-delivered  Discharge Information: Date: 11/22/2014 Activity: pelvic rest Diet: routine  Medications: Ibuprofen Breast feeding:  Yes Condition: stable Instructions: refer to handout Discharge to: home   Follow-up Information    Follow up with [facility]. Schedule an appointment as soon as possible for a visit in 4 weeks.   Why:  For your postpartum appointment.   Contact  information:   [address] [place] [id number] [phone]        Medication List    TAKE these medications        ibuprofen 600 MG tablet  Commonly known as:  ADVIL,MOTRIN  Take 1 tablet (600 mg total) by mouth every 6 (six) hours.     multivitamin-prenatal 27-0.8 MG Tabs tablet  Take 1 tablet by mouth daily at 12 noon.         Araceli Boucheumley, Rolette N , DO Family Medicine, PGY-1  11/22/2014,7:53 AM   I have seen and examined this patient and I agree with the above. Cam HaiSHAW, Jisella Ashenfelter CNM 9:27 AM 11/22/2014

## 2014-11-22 NOTE — Discharge Instructions (Signed)

## 2015-01-03 ENCOUNTER — Encounter: Payer: Self-pay | Admitting: Advanced Practice Midwife

## 2015-01-03 ENCOUNTER — Ambulatory Visit: Payer: BLUE CROSS/BLUE SHIELD | Admitting: Advanced Practice Midwife

## 2015-01-07 ENCOUNTER — Ambulatory Visit: Payer: BLUE CROSS/BLUE SHIELD | Admitting: Women's Health

## 2015-01-10 ENCOUNTER — Telehealth: Payer: Self-pay | Admitting: *Deleted

## 2015-01-10 ENCOUNTER — Ambulatory Visit (INDEPENDENT_AMBULATORY_CARE_PROVIDER_SITE_OTHER): Payer: BLUE CROSS/BLUE SHIELD | Admitting: Advanced Practice Midwife

## 2015-01-10 ENCOUNTER — Encounter: Payer: Self-pay | Admitting: Advanced Practice Midwife

## 2015-01-10 NOTE — Telephone Encounter (Signed)
Carly Harris with [employer] states pt began FMLA on 02/01 however did not deliver until 11/20/14. Was pt taken out on 02/02 due to medical reasons. Informed would discuss with provider and contact [employer] with information.

## 2015-01-10 NOTE — Progress Notes (Signed)
  Carly ProwsKaren Harris is a 24 y.o. who presents for a postpartum visit. She is 7 weeks postpartum following a spontaneous vaginal delivery. I have fully reviewed the prenatal and intrapartum course. The delivery was at 39.0 gestational weeks. She had IOL for IUGR.  Anesthesia: epidural. Postpartum course has been uneventful. Baby's course has been uneventful. Baby is feeding by breast. Bleeding: no bleeding. Bowel function is normal. Bladder function is normal. Patient is not sexually active. Contraception method is condoms. Postpartum depression screening: negative.   Current outpatient prescriptions:  .  Prenatal Vit-Fe Fumarate-FA (MULTIVITAMIN-PRENATAL) 27-0.8 MG TABS tablet, Take 1 tablet by mouth daily at 12 noon., Disp: , Rfl:  .  ibuprofen (ADVIL,MOTRIN) 600 MG tablet, Take 1 tablet (600 mg total) by mouth every 6 (six) hours. (Patient not taking: Reported on 01/10/2015), Disp: 30 tablet, Rfl: 0  Review of Systems   Constitutional: Negative for fever and chills Eyes: Negative for visual disturbances Respiratory: Negative for shortness of breath, dyspnea Cardiovascular: Negative for chest pain or palpitations  Gastrointestinal: Negative for vomiting, diarrhea and constipation Genitourinary: Negative for dysuria and urgency Musculoskeletal: Negative for back pain, joint pain, myalgias  Neurological: Negative for dizziness and headaches   Objective:     Filed Vitals:   01/10/15 1521  BP: 104/84  Pulse: 68   General:  alert, cooperative and no distress   Breasts:  negative  Lungs: clear to auscultation bilaterally  Heart:  regular rate and rhythm  Abdomen: Soft, nontender   Vulva:  normal  Vagina: normal vagina  Cervix:  closed  Corpus: Well involuted     Rectal Exam: no hemorrhoids        Assessment:    normal postpartum exam.  Plan:    1. Contraception: condoms 2. Follow up in:   or as needed.

## 2015-08-02 ENCOUNTER — Emergency Department (HOSPITAL_COMMUNITY)
Admission: EM | Admit: 2015-08-02 | Discharge: 2015-08-02 | Disposition: A | Discharge status: Home / Self Care | Payer: Medicaid Other | Attending: Emergency Medicine | Admitting: Emergency Medicine

## 2015-08-02 ENCOUNTER — Encounter (HOSPITAL_COMMUNITY): Payer: Self-pay | Admitting: Emergency Medicine

## 2015-08-02 DIAGNOSIS — Z3202 Encounter for pregnancy test, result negative: Secondary | ICD-10-CM | POA: Diagnosis not present

## 2015-08-02 DIAGNOSIS — Z8679 Personal history of other diseases of the circulatory system: Secondary | ICD-10-CM | POA: Diagnosis not present

## 2015-08-02 DIAGNOSIS — M546 Pain in thoracic spine: Secondary | ICD-10-CM | POA: Insufficient documentation

## 2015-08-02 DIAGNOSIS — R350 Frequency of micturition: Secondary | ICD-10-CM | POA: Diagnosis not present

## 2015-08-02 DIAGNOSIS — M7918 Myalgia, other site: Secondary | ICD-10-CM

## 2015-08-02 DIAGNOSIS — M545 Low back pain: Secondary | ICD-10-CM | POA: Diagnosis not present

## 2015-08-02 DIAGNOSIS — Z79899 Other long term (current) drug therapy: Secondary | ICD-10-CM | POA: Diagnosis not present

## 2015-08-02 HISTORY — DX: Migraine with aura, not intractable, without status migrainosus: G43.109

## 2015-08-02 LAB — URINALYSIS, ROUTINE W REFLEX MICROSCOPIC
Bilirubin Urine: NEGATIVE
Glucose, UA: NEGATIVE mg/dL
HGB URINE DIPSTICK: NEGATIVE
Ketones, ur: NEGATIVE mg/dL
LEUKOCYTES UA: NEGATIVE
NITRITE: NEGATIVE
PROTEIN: NEGATIVE mg/dL
SPECIFIC GRAVITY, URINE: 1.015 (ref 1.005–1.030)
UROBILINOGEN UA: 0.2 mg/dL (ref 0.0–1.0)
pH: 8.5 — ABNORMAL HIGH (ref 5.0–8.0)

## 2015-08-02 LAB — PREGNANCY, URINE: PREG TEST UR: NEGATIVE

## 2015-08-02 MED ORDER — METHOCARBAMOL 500 MG PO TABS: 1000.0000 mg | ORAL_TABLET | Freq: Four times a day (QID) | ORAL | Status: AC | PRN

## 2015-08-02 MED ORDER — NAPROXEN 250 MG PO TABS: 250.0000 mg | ORAL_TABLET | Freq: Two times a day (BID) | ORAL | Status: AC | PRN

## 2015-08-02 NOTE — Discharge Instructions (Signed)
°Emergency Department Resource Guide °1) Find a Doctor and Pay Out of Pocket °Although you won't have to find out who is covered by your insurance plan, it is a good idea to ask around and get recommendations. You will then need to call the office and see if the doctor you have chosen will accept you as a new patient and what types of options they offer for patients who are self-pay. Some doctors offer discounts or will set up payment plans for their patients who do not have insurance, but you will need to ask so you aren't surprised when you get to your appointment. ° °2) Contact Your Local Health Department °Not all health departments have doctors that can see patients for sick visits, but many do, so it is worth a call to see if yours does. If you don't know where your local health department is, you can check in your phone book. The CDC also has a tool to help you locate your state's health department, and many state websites also have listings of all of their local health departments. ° °3) Find a Walk-in Clinic °If your illness is not likely to be very severe or complicated, you may want to try a walk in clinic. These are popping up all over the country in pharmacies, drugstores, and shopping centers. They're usually staffed by nurse practitioners or physician assistants that have been trained to treat common illnesses and complaints. They're usually fairly quick and inexpensive. However, if you have serious medical issues or chronic medical problems, these are probably not your best option. ° °No Primary Care Doctor: °- Call [facility] at  832-8000 - they can help you locate a primary care doctor that  accepts your insurance, provides certain services, etc. °- Physician Referral Service- 1-800-533-3463 ° °Chronic Pain Problems: °Organization         Address  Phone   Notes  °[facility]  (336) 297-2271 Patients need to be referred by their primary care doctor.  ° °Medication  Assistance: °Organization         Address  Phone   Notes  °[place] [facility] [address] °[place], [place] [address] (336) 641-8030 --Must be a resident of [place] °-- Must have NO insurance coverage whatsoever (no Medicaid/ Medicare, etc.) °-- The pt. MUST have a primary care doctor that directs their care regularly and follows them in the community °  °[facility]  (866) 331-1348   °[employer]  (888) 892-1162   ° °Agencies that provide inexpensive medical care: °Organization         Address  Phone   Notes  °[facility]  (336) 832-8035   °[facility]    (336) 832-7272   °[facility] [address] °[place], [place] [address] (336) 832-4777   °[facility] [address], °[place] (336) 271-4999   °[facility]    (336) 373-0678   °[facility]    (336) 272-1050   °[facility] ° [address], [place] Phone:  (336) 832-4444, Fax:  (336) 832-4440 Hours of Operation:  9 am - 6 pm, M-F.  Also accepts Medicaid/Medicare and self-pay.  °[facility] ° [address],  Phone: (336) 832-3150, Fax: (336) 832-3151. Hours of Operation:  8:30 am - 5:30 pm, M-F.  Also accepts Medicaid and self-pay.  °[facility] [place] [address], [place] Phone: (336) 878-6027   °[facility] [address], [place], [place] (336)723-1848, Ext. 123 Mondays & Thursdays: 7-9 AM.  First 15 patients are seen on a first come, first serve basis. °  ° °Medicaid-accepting [place] Providers: ° °Organization         Address  Phone   Notes  °[facility] [address], [place] (336) 641-2100 Also accepts self-pay patients.  °[facility] [address], [place] ° (336) 856-9996   °[facility] [address], [place]  (336) 288-8857   °[facility] [address] [place] [address], [place] (336) 299-7000   °Veita Bland [address], [place]  ° (336) 373-1557 Only accepts [place] Access Medicaid patients after they have their name applied to their card.  ° °Self-Pay (no insurance) in [place]: ° °Organization         Address  Phone   Notes  °Sickle Cell Patients, [facility] [address], [place] (336) 832-1970   °[facility] [address], [place] (336) 832-4400   °[facility] ° [address], [place] (336) 992-4800   °[facility]/Dr. Osei-Bonsu ° [address] [place] [address], [place] or [address], [place] (336) 841-8500 Phone number for both [place] and [place] locations is the same.  °[facility] [address], [place] (336) 299-0000   °[facility] [address] [place] [address], [place] or [address] (336) 852-7530 °(336) 878-2260   °[facility] [address], [place] (336) 350-1642, phone; (336) 294-5005, fax Sees patients 1st and 3rd Saturday of every month.  Must not qualify for public or private insurance (i.e. Medicaid, Medicare, [place] Health Choice, Veterans' Benefits) • Household income should be no more than 200% of the poverty level •The clinic cannot treat you if you are pregnant or think you are pregnant • Sexually transmitted diseases are not treated at the clinic.  ° ° °Dental Care: °Organization         Address  Phone  Notes  °[place] [facility] [address], [place] (336) 641-6152 Accepts children up to age 21 who are enrolled in Medicaid or [place] Health Choice; pregnant women with a Medicaid card; and children who have applied for Medicaid or [place] Health Choice, but were declined, whose parents can pay a reduced fee at time of service.  °[place]  [facility] [place]  [address], [place] (336) 641-7733 Accepts children up to age 21 who are enrolled in Medicaid or [place] Health Choice; pregnant women with a Medicaid card; and children who have applied for Medicaid or  Health Choice, but were declined, whose parents can pay a reduced fee at time of service.  °[facility] ° [address], [place] (336) 641-4533 Patients are seen by appointment only. Walk-ins are not accepted. [facility] will see patients 18 years of age and older. °Monday - Tuesday (8am-5pm) °Most Wednesdays (8:30-5pm) °$30 per visit, cash only  °[facility] ° [address], [place] (336) 641-4533 Patients are seen by appointment only. Walk-ins are not accepted. [facility] will see patients 18 years of age and older. °One   Wednesday Evening (Monthly: Volunteer Based).  $30 per visit, cash only  °[facility]  (919) 537-3737 for adults; Children under age 4, call Graduate Pediatric Dentistry at ([phone]) [phone]. Children aged 4-14, please call (919) [phone] to request a pediatric application. ° Dental services are provided in all areas of dental care including fillings, crowns and bridges, complete and partial dentures, implants, gum treatment, root canals, and extractions. Preventive care is also provided. Treatment is provided to both adults and children. °Patients are selected via a lottery and there is often a waiting list. °  °[facility] [address], °[place] ° (336) 763-8833 [email or web address] °  °[facility] [address], [place], [place] (336)723-1848, Ext. 123 Second and Fourth Thursday of each month, opens at 6:30 AM; Clinic ends at 9 AM.  Patients are seen on a first-come first-served basis, and a limited number are seen during each clinic.  ° °[facility] ° [address], [place], [place] (336) 723-7904    Eligibility Requirements °You must have lived in [place], [place], or [place] counties for at least the last three months. °  You cannot be eligible for state or federal sponsored healthcare insurance, including Veterans Administration, Medicaid, or Medicare. °  You generally cannot be eligible for healthcare insurance through your employer.  °  How to apply: °Eligibility screenings are held every Tuesday and Wednesday afternoon from 1:00 pm until 4:00 pm. You do not need an appointment for the interview!  °[facility] [address], [place], [place] 336-631-2330   °[facility]  336-342-8273   °[facility]  336-703-3100   °[facility]  336-570-6415   ° °Behavioral Health Resources in the Community: °Intensive Outpatient Programs °Organization         Address  Phone  Notes  °[facility] [address], [place], [place] 336-878-6098   °[facility] [address], [place], [place] 336-832-9800   °[facility] [address], [place], [place] ° 336-882-2125   °[facility] [address],  °[place], [place] 1-800-853-5163 or 336-641-4981   °Substance Abuse Resources °Organization         Address  Phone  Notes  °[facility]  336-882-2125   °[facility]  336-784-9470   °[facility]  336-285-9073   °[facility]  336-845-3988   °Residential & Outpatient Substance Abuse Program  1-800-659-3381   °Psychological Services °Organization         Address  Phone  Notes  °[facility]  336- 832-9600   °[facility]  336- 378-7881   °[facility] [address], [place] 1-800-853-5163 or 336-641-4981   ° °Mobile Crisis Teams °Organization         Address  Phone  Notes  °[facility], Mobile Crisis Care Unit  1-877-626-1772   °Assertive °[facility] ° [address]  [place], [place] 336-834-9664   °Sharon DeEsch [address] °[place] 336-554-5454   ° °Self-Help/Support Groups °Organization         Address  Phone             Notes  °[facility] [place] - variety of support groups  336- 373-1402 Call for more information  °Narcotics Anonymous (NA), [facility] [address], °[place]   2 meetings at this location  ° °  Residential Treatment Programs °Organization         Address  Phone  Notes  °[facility] [address],    °[place]  1-866-801-8205   °[facility] ° [address], [place], [place] 704-293-8524   °[facility] [address], [place] 336-845-3988 Admissions: 8am-3pm M-F  °[facility] [address]    °[place], [place] 336-841-1104   °[facility] [address], [place], [place] 336-379-7146   °[facility] [address]  °[place], [place] 336-285-9073   °[facility] Intensive Outpatient [address], [place], [place] 336-852-3033   °[facility] [address]  °[place], [place] 1-877-615-2722 or 336-784-9470   °[facility] [address] [place], [place] 336-227-7417 Accepts Medicaid  °[facility] [address]  °[place] 1-800-659-3381 Substance Abuse/Addiction Treatment  ° °[place] Behavioral Health Resources °Organization         Address  Phone  Notes  °[facility]  (888) 581-9988   °Julie Brannon, PhD [address] [place], [place]   (336) 349-5553 or (336) 951-0000   °[facility]   [address] °[place], [place] (336) 349-4454   °[facility] [address], [place], [place] (336) 342-8316 Insurance/Medicaid/sponsorship through [facility]  °[facility] [address]                                    [place], [place] (336) 342-8316 Therapy/tele-psych/case    °[facility] [address]  ° [place], [place] (336) 349-2233    °Dr. Arfeen  (336) 349-4544   °[facility] [place]  [employer] [place] [facility] 1) [address], [place] °2) [address], [place] °3)  [address], [place] (336) 349-3220 °(336) 342-7768 ° °(336) 342-8140   °[place] [facility] (336) 342-1394 or (336) 342-3537 (After Hours)    ° ° °Take the prescriptions as directed. May also take over the counter tylenol, as directed on packaging, as needed for discomfort. Apply moist heat or ice to the area(s) of discomfort, for 15 minutes at a time, several times per day for the next few days.  Do not fall asleep on a heating or ice pack.  Call your regular medical doctor today to schedule a follow up appointment in the next 2 days.  Return to the Emergency Department immediately if worsening. ° °

## 2015-08-02 NOTE — ED Notes (Signed)
Pt reports lower back pain and frequency/urgency with urination.

## 2015-08-02 NOTE — ED Provider Notes (Signed)
CSN: [id number]     Arrival date & time 08/02/15  0203 History   First MD Initiated Contact with Patient 08/02/15 0210     Chief Complaint  Patient presents with  . Back Pain      HPI Pt was seen at 0210. Per pt, c/o gradual onset and persistence of constant right sided low back "pain" for the past 2 days. Pain worsens with palpation of the area and body position changes. Pt also c/o urinary frequency. Denies dysuria, no hematuria, no abd or pelvic pain, no N/V/D, no vaginal bleeding/discharge. Denies incont/retention of bowel or bladder, no saddle anesthesia, no focal motor weakness, no tingling/numbness in extremities, no fevers, no injury.    Past Medical History  Diagnosis Date  . Complicated migraine    Past Surgical History  Procedure Laterality Date  . No past surgeries     Family History  Problem Relation Age of Onset  . Diabetes Maternal Grandmother    Social History  Substance Use Topics  . Smoking status: Never Smoker   . Smokeless tobacco: Never Used  . Alcohol Use: No     Comment: not now   OB History    Gravida Para Term Preterm AB TAB SAB Ectopic Multiple Living   2 2 2       0 2     Review of Systems ROS: Statement: All systems negative except as marked or noted in the HPI; Constitutional: Negative for fever and chills. ; ; Eyes: Negative for eye pain, redness and discharge. ; ; ENMT: Negative for ear pain, hoarseness, nasal congestion, sinus pressure and sore throat. ; ; Cardiovascular: Negative for chest pain, palpitations, diaphoresis, dyspnea and peripheral edema. ; ; Respiratory: Negative for cough, wheezing and stridor. ; ; Gastrointestinal: Negative for nausea, vomiting, diarrhea, abdominal pain, blood in stool, hematemesis, jaundice and rectal bleeding. . ; ; Genitourinary: +urinary frequency. Negative for dysuria, flank pain and hematuria. ; ; GYN:  No vaginal bleeding, no vaginal discharge, no vulvar pain. ;; Musculoskeletal: +LBP. Negative for neck  pain. Negative for swelling and trauma.; ; Skin: Negative for pruritus, rash, abrasions, blisters, bruising and skin lesion.; ; Neuro: Negative for headache, lightheadedness and neck stiffness. Negative for weakness, altered level of consciousness , altered mental status, extremity weakness, paresthesias, involuntary movement, seizure and syncope.      Allergies  Review of patient's allergies indicates no known allergies.  Home Medications   Prior to Admission medications   Medication Sig Start Date End Date Taking? Authorizing Provider  ibuprofen (ADVIL,MOTRIN) 600 MG tablet Take 1 tablet (600 mg total) by mouth every 6 (six) hours. 11/22/14  Yes [place] N Rumley, DO  Prenatal Vit-Fe Fumarate-FA (MULTIVITAMIN-PRENATAL) 27-0.8 MG TABS tablet Take 1 tablet by mouth daily at 12 noon.    Historical Provider, MD   BP 109/70 mmHg  Pulse 85  Temp(Src) 97.6 F (36.4 C) (Oral)  Resp 20  Ht 5' (1.524 m)  Wt 129 lb 6 oz (58.684 kg)  BMI 25.27 kg/m2  SpO2 100%  LMP 06/02/2015 (Approximate) Physical Exam  0215: Physical examination:  Nursing notes reviewed; Vital signs and O2 SAT reviewed;  Constitutional: Well developed, Well nourished, Well hydrated, In no acute distress; Head:  Normocephalic, atraumatic; Eyes: EOMI, PERRL, No scleral icterus; ENMT: Mouth and pharynx normal, Mucous membranes moist; Neck: Supple, Full range of motion, No lymphadenopathy; Cardiovascular: Regular rate and rhythm, No murmur, rub, or gallop; Respiratory: Breath sounds clear & equal bilaterally, No rales, rhonchi, wheezes.  Speaking  full sentences with ease, Normal respiratory effort/excursion; Chest: Nontender, Movement normal; Abdomen: Soft, Nontender, Nondistended, Normal bowel sounds; Genitourinary: No CVA tenderness; Spine:  No midline CS, TS, LS tenderness. +TTP right thoracic and lumbar paraspinal muscles.;;  Extremities: Pulses normal, No tenderness, No edema, No calf edema or asymmetry.; Neuro: AA&Ox3, Major CN  grossly intact.  Speech clear. No gross focal motor or sensory deficits in extremities. Climbs on and off stretcher easily by herself. Gait steady.; Skin: Color normal, Warm, Dry.   ED Course  Procedures (including critical care time) Labs Review   Imaging Review  I have personally reviewed and evaluated these images and lab results as part of my medical decision-making.   EKG Interpretation None      MDM  MDM Reviewed: previous chart, nursing note and vitals Interpretation: labs     Results for orders placed or performed during the hospital encounter of 08/02/15  Urinalysis, Routine w reflex microscopic  Result Value Ref Range   Color, Urine YELLOW YELLOW   APPearance CLEAR CLEAR   Specific Gravity, Urine 1.015 1.005 - 1.030   pH 8.5 (H) 5.0 - 8.0   Glucose, UA NEGATIVE NEGATIVE mg/dL   Hgb urine dipstick NEGATIVE NEGATIVE   Bilirubin Urine NEGATIVE NEGATIVE   Ketones, ur NEGATIVE NEGATIVE mg/dL   Protein, ur NEGATIVE NEGATIVE mg/dL   Urobilinogen, UA 0.2 0.0 - 1.0 mg/dL   Nitrite NEGATIVE NEGATIVE   Leukocytes, UA NEGATIVE NEGATIVE  Pregnancy, urine  Result Value Ref Range   Preg Test, Ur NEGATIVE NEGATIVE    0255:  No UTI on Udip; denies dysuria, pelvic pain or any GU symptoms. Msk pain on exam; tx symptomatically. Dx and testing d/w pt and family.  Questions answered.  Verb understanding, agreeable to d/c home with outpt f/u.     Samuel Jester, DO 08/05/15 [phone]
